# Patient Record
Sex: Female | Born: 1965 | Race: White | Hispanic: No | Marital: Married | Smoking: Former smoker
Health system: Southern US, Community
[De-identification: ages and names within clinical notes are randomized; demographics above are authoritative.]

## PROBLEM LIST (undated history)

## (undated) DIAGNOSIS — M1990 Unspecified osteoarthritis, unspecified site: Secondary | ICD-10-CM

## (undated) HISTORY — PX: HAND SURGERY: SHX662

## (undated) HISTORY — PX: DILATION AND CURETTAGE OF UTERUS: SHX78

## (undated) HISTORY — PX: AUGMENTATION MAMMAPLASTY: SUR837

## (undated) HISTORY — DX: Unspecified osteoarthritis, unspecified site: M19.90

## (undated) HISTORY — PX: FOOT SURGERY: SHX648

## (undated) HISTORY — PX: TUBAL LIGATION: SHX77

## (undated) HISTORY — PX: ENDOMETRIAL ABLATION: SHX621

## (undated) HISTORY — PX: BREAST SURGERY: SHX581

---

## 2002-06-20 ENCOUNTER — Other Ambulatory Visit: Admission: RE | Admit: 2002-06-20 | Discharge: 2002-06-20 | Payer: Self-pay | Admitting: *Deleted

## 2002-08-11 ENCOUNTER — Ambulatory Visit (HOSPITAL_COMMUNITY): Admission: RE | Admit: 2002-08-11 | Discharge: 2002-08-11 | Payer: Self-pay | Admitting: Family Medicine

## 2002-08-11 ENCOUNTER — Encounter: Payer: Self-pay | Admitting: Family Medicine

## 2005-06-09 ENCOUNTER — Other Ambulatory Visit: Admission: RE | Admit: 2005-06-09 | Discharge: 2005-06-09 | Payer: Self-pay | Admitting: *Deleted

## 2005-12-03 ENCOUNTER — Emergency Department (HOSPITAL_COMMUNITY): Admission: EM | Admit: 2005-12-03 | Discharge: 2005-12-03 | Payer: Self-pay | Admitting: Emergency Medicine

## 2006-07-23 ENCOUNTER — Ambulatory Visit (HOSPITAL_COMMUNITY): Admission: RE | Admit: 2006-07-23 | Discharge: 2006-07-23 | Payer: Self-pay | Admitting: Family Medicine

## 2006-08-24 ENCOUNTER — Other Ambulatory Visit: Admission: RE | Admit: 2006-08-24 | Discharge: 2006-08-24 | Payer: Self-pay | Admitting: *Deleted

## 2007-08-18 ENCOUNTER — Other Ambulatory Visit: Admission: RE | Admit: 2007-08-18 | Discharge: 2007-08-18 | Payer: Self-pay | Admitting: *Deleted

## 2012-02-23 DIAGNOSIS — M19079 Primary osteoarthritis, unspecified ankle and foot: Secondary | ICD-10-CM | POA: Insufficient documentation

## 2015-02-19 ENCOUNTER — Other Ambulatory Visit (HOSPITAL_COMMUNITY)
Admission: RE | Admit: 2015-02-19 | Discharge: 2015-02-19 | Disposition: A | Discharge status: Home / Self Care | Payer: BC Managed Care – PPO | Source: Ambulatory Visit | Attending: Gynecology | Admitting: Gynecology

## 2015-02-19 ENCOUNTER — Encounter: Payer: Self-pay | Admitting: Gynecology

## 2015-02-19 ENCOUNTER — Ambulatory Visit (INDEPENDENT_AMBULATORY_CARE_PROVIDER_SITE_OTHER): Payer: BC Managed Care – PPO | Admitting: Gynecology

## 2015-02-19 VITALS — BP 124/80 | Ht 60.0 in | Wt 150.0 lb

## 2015-02-19 DIAGNOSIS — N951 Menopausal and female climacteric states: Secondary | ICD-10-CM | POA: Diagnosis not present

## 2015-02-19 DIAGNOSIS — Z1151 Encounter for screening for human papillomavirus (HPV): Secondary | ICD-10-CM | POA: Diagnosis present

## 2015-02-19 DIAGNOSIS — Z01419 Encounter for gynecological examination (general) (routine) without abnormal findings: Secondary | ICD-10-CM | POA: Insufficient documentation

## 2015-02-19 DIAGNOSIS — R140 Abdominal distension (gaseous): Secondary | ICD-10-CM

## 2015-02-19 NOTE — Addendum Note (Signed)
Addended by: Berna SpareASTILLO, Ajmal Kathan A on: 02/19/2015 10:54 AM   Modules accepted: Orders

## 2015-02-19 NOTE — Progress Notes (Signed)
Alicia Bowman 20-Aug-1966 [id number]   History:    49 y.o.  for annual gyn exam who is a new patient to the practice. Patient prior gynecologist was Dr. Jeanine Luz which she has not seen in many years. She has not had a gynecological exam. She stated that many years ago she had D&C for dysfunctional uterine bleeding and also he had done an endometrial ablation for menorrhagia in the past. She denied any prior history of any abnormal Pap smear. Her PCP is Dr. Tiburcio Pea at [facility] who is been doing her blood work. She is also overdue for her mammogram. Patient was having some slight vasomotor symptoms and some vague lower abdominal discomfort and slight bloating but no dysfunctional uterine bleeding reported.  Past medical history,surgical history, family history and social history were all reviewed and documented in the EPIC chart.  Gynecologic History No LMP recorded. Patient has had an ablation. Contraception: None Last Pap: Over 5 years ago. Results were: normal Last mammogram: 2 years ago. Results were: normal  Obstetric History OB History  Gravida Para Term Preterm AB SAB TAB Ectopic Multiple Living  # Outcome Date GA Lbr Len/2nd Weight Sex Delivery Anes PTL Lv  3 Para           2 Para           1 Para                ROS: A ROS was performed and pertinent positives and negatives are included in the history.  GENERAL: No fevers or chills. HEENT: No change in vision, no earache, sore throat or sinus congestion. NECK: No pain or stiffness. CARDIOVASCULAR: No chest pain or pressure. No palpitations. PULMONARY: No shortness of breath, cough or wheeze. GASTROINTESTINAL: No abdominal pain, nausea, vomiting or diarrhea, melena or bright red blood per rectum. GENITOURINARY: No urinary frequency, urgency, hesitancy or dysuria. MUSCULOSKELETAL: No joint or muscle pain, no back pain, no recent trauma. DERMATOLOGIC: No rash, no itching, no lesions. ENDOCRINE: No  polyuria, polydipsia, no heat or cold intolerance. No recent change in weight. HEMATOLOGICAL: No anemia or easy bruising or bleeding. NEUROLOGIC: No headache, seizures, numbness, tingling or weakness. PSYCHIATRIC: No depression, no loss of interest in normal activity or change in sleep pattern.     Exam: chaperone present  BP 124/80 mmHg  Ht 5' (1.524 m)  Wt 150 lb (68.04 kg)  BMI 29.30 kg/m2  Body mass index is 29.3 kg/(m^2).  General appearance : Well developed well nourished female. No acute distress HEENT: Eyes: no retinal hemorrhage or exudates,  Neck supple, trachea midline, no carotid bruits, no thyroidmegaly Lungs: Clear to auscultation, no rhonchi or wheezes, or rib retractions  Heart: Regular rate and rhythm, no murmurs or gallops Breast:Examined in sitting and supine position were symmetrical in appearance, no palpable masses or tenderness,  no skin retraction, no nipple inversion, no nipple discharge, no skin discoloration, no axillary or supraclavicular lymphadenopathy Abdomen: no palpable masses or tenderness, no rebound or guarding Extremities: no edema or skin discoloration or tenderness  Pelvic:  Bartholin, Urethra, Skene Glands: Within normal limits             Vagina: No gross lesions or discharge  Cervix: No gross lesions or discharge, ectropion  Uterus  anteverted, normal size, shape and consistency, non-tender and mobile  Adnexa  Without masses or tenderness  Anus and perineum  normal  Rectovaginal  normal sphincter tone without palpated masses or tenderness             Hemoccult not indicated     Assessment/Plan:  49 y.o. female for annual exam who is long overdue for her gynecological examination. Since his been over 5 years since her last Pap smear will will do a Pap smear with HPV screening today. Her PCP we'll be doing her blood work. She was given a requisition to schedule her mammogram. Because of the bloating sensation and slight tenderness on the left  lower quadrant at time of exam today pelvic ultrasound was ordered. We'll also check her FSH and she appears to be perimenopausal. She was reminded of the importance of calcium vitamin D and regular exercise for osteoporosis prevention.   Ok EdwardsFERNANDEZ,JUAN H MD, 10:48 AM 02/19/2015

## 2015-02-19 NOTE — Patient Instructions (Signed)
Transvaginal Ultrasound Transvaginal ultrasound is a pelvic ultrasound, using a metal probe that is placed in the vagina, to look at a women's female organs. Transvaginal ultrasound is a method of seeing inside the pelvis of a woman. The ultrasound machine sends out sound waves from the transducer (probe). These sound waves bounce off body structures (like an echo) to create a picture. The picture shows up on a monitor. It is called transvaginal because the probe is inserted into the vagina. There should be very little discomfort from the vaginal probe. This test can also be used during pregnancy. Endovaginal ultrasound is another name for a transvaginal ultrasound. In a transabdominal ultrasound, the probe is placed on the outside of the belly. This method gives pictures that are lower quality than pictures from the transvaginal technique. Transvaginal ultrasound is used to look for problems of the female genital tract. Some such problems include:  Infertility problems.  Congenital (birth defect) malformations of the uterus and ovaries.  Tumors in the uterus.  Abnormal bleeding.  Ovarian tumors and cysts.  Abscess (inflamed tissue around pus) in the pelvis.  Unexplained abdominal or pelvic pain.  Pelvic infection. DURING PREGNANCY, TRANSVAGINAL ULTRASOUND MAY BE USED TO LOOK AT:  Normal pregnancy.  Ectopic pregnancy (pregnancy outside the uterus).  Fetal heartbeat.  Abnormalities in the pelvis, that are not seen well with transabdominal ultrasound.  Suspected twins or multiples.  Impending miscarriage.  Problems with the cervix (incompetent cervix, not able to stay closed and hold the baby).  When doing an amniocentesis (removing fluid from the pregnancy sac, for testing).  Looking for abnormalities of the baby.  Checking the growth, development, and age of the fetus.  Measuring the amount of fluid in the amniotic sac.  When doing an external version of the baby (moving  baby into correct position).  Evaluating the baby for problems in high risk pregnancies (biophysical profile).  Suspected fetal demise (death). Sometimes a special ultrasound method called Saline Infusion Sonography (SIS) is used for a more accurate look at the uterus. Sterile saline (salt water) is injected into the uterus of non-pregnant patients to see the inside of the uterus better. SIS is not used on pregnant women. The vaginal probe can also assist in obtaining biopsies of abnormal areas, in draining fluid from cysts on the ovary, and in finding IUDs (intrauterine device, birth control) that cannot be located. PREPARATION FOR TEST A transvaginal ultrasound is done with the bladder empty. The transabdominal ultrasound is done with your bladder full. You may be asked to drink several glasses of water before that exam. Sometimes, a transabdominal ultrasound is done just after a transvaginal ultrasound, to look at organs in your abdomen. PROCEDURE  You will lie down on a table, with your knees bent and your feet in foot holders. The probe is covered with a condom. A sterile lubricant is put into the vagina and on the probe. The lubricant helps transmit the sound waves and avoid irritating the vagina. Your caregiver will move the probe inside the vaginal cavity to scan the pelvic structures. A normal test will show a normal pelvis and normal contents. An abnormal test will show abnormalities of the pelvis, placenta, or baby. ABNORMAL RESULTS MAY BE DUE TO:  Growths or tumors in the:  Uterus.  Ovaries.  Vagina.  Other pelvic structures.  Non-cancerous growths of the uterus and ovaries.  Twisting of the ovary, cutting off blood supply to the ovary (ovarian torsion).  Areas of infection, including:  Pelvic  inflammatory disease.  Abscess in the pelvis.  Locating an IUD. PROBLEMS FOUND IN PREGNANT WOMEN MAY INCLUDE:  Ectopic pregnancy (pregnancy outside the uterus).  Multiple  pregnancies.  Early dilation (opening) of the cervix. This may indicate an incompetent cervix and early delivery.  Impending miscarriage.  Fetal death.  Problems with the placenta, including:  Placenta has grown over the opening of the womb (placenta previa).  Placenta has separated early in the womb (placental abruption).  Placenta grows into the muscle of the uterus (placenta accreta).  Tumors of pregnancy, including gestational trophoblastic disease. This is an abnormal pregnancy, with no fetus. The uterus is filled with many grape-like cysts that could sometimes be cancerous.  Incorrect position of the fetus (breech, vertex).  Intrauterine fetal growth retardation (IUGR) (poor growth in the womb).  Fetal abnormalities or infection. RISKS AND COMPLICATIONS There are no known risks to the ultrasound procedure. There is no X-ray used when doing an ultrasound. Document Released: 10/28/2004 Document Revised: 02/08/2012 Document Reviewed: 10/16/2009 [facility] Patient Information 2015 [place], [place]. This information is not intended to replace advice given to you by your health care provider. Make sure you discuss any questions you have with your health care provider. Perimenopause Perimenopause is the time when your body begins to move into the menopause (no menstrual period for 12 straight months). It is a natural process. Perimenopause can begin 2-8 years before the menopause and usually lasts for 1 year after the menopause. During this time, your ovaries may or may not produce an egg. The ovaries vary in their production of estrogen and progesterone hormones each month. This can cause irregular menstrual periods, difficulty getting pregnant, vaginal bleeding between periods, and uncomfortable symptoms. CAUSES  Irregular production of the ovarian hormones, estrogen and progesterone, and not ovulating every month.  Other causes include:  Tumor of the pituitary gland in the  brain.  Medical disease that affects the ovaries.  Radiation treatment.  Chemotherapy.  Unknown causes.  Heavy smoking and excessive alcohol intake can bring on perimenopause sooner. SIGNS AND SYMPTOMS   Hot flashes.  Night sweats.  Irregular menstrual periods.  Decreased sex drive.  Vaginal dryness.  Headaches.  Mood swings.  Depression.  Memory problems.  Irritability.  Tiredness.  Weight gain.  Trouble getting pregnant.  The beginning of losing bone cells (osteoporosis).  The beginning of hardening of the arteries (atherosclerosis). DIAGNOSIS  Your health care provider will make a diagnosis by analyzing your age, menstrual history, and symptoms. He or she will do a physical exam and note any changes in your body, especially your female organs. Female hormone tests may or may not be helpful depending on the amount of female hormones you produce and when you produce them. However, other hormone tests may be helpful to rule out other problems. TREATMENT  In some cases, no treatment is needed. The decision on whether treatment is necessary during the perimenopause should be made by you and your health care provider based on how the symptoms are affecting you and your lifestyle. Various treatments are available, such as:  Treating individual symptoms with a specific medicine for that symptom.  Herbal medicines that can help specific symptoms.  Counseling.  Group therapy. HOME CARE INSTRUCTIONS   Keep track of your menstrual periods (when they occur, how heavy they are, how long between periods, and how long they last) as well as your symptoms and when they started.  Only take over-the-counter or prescription medicines as directed by your health care provider.  Sleep and rest.  Exercise.  Eat a diet that contains calcium (good for your bones) and soy (acts like the estrogen hormone).  Do not smoke.  Avoid alcoholic beverages.  Take vitamin  supplements as recommended by your health care provider. Taking vitamin E may help in certain cases.  Take calcium and vitamin D supplements to help prevent bone loss.  Group therapy is sometimes helpful.  Acupuncture may help in some cases. SEEK MEDICAL CARE IF:   You have questions about any symptoms you are having.  You need a referral to a specialist (gynecologist, psychiatrist, or psychologist). SEEK IMMEDIATE MEDICAL CARE IF:   You have vaginal bleeding.  Your period lasts longer than 8 days.  Your periods are recurring sooner than 21 days.  You have bleeding after intercourse.  You have severe depression.  You have pain when you urinate.  You have severe headaches.  You have vision problems. Document Released: 12/24/2004 Document Revised: 09/06/2013 Document Reviewed: 06/15/2013 [facility] Patient Information 2015 [place]. This information is not intended to replace advice given to you by your health care provider. Make sure you discuss any questions you have with your health care provider.

## 2015-02-20 LAB — CYTOLOGY - PAP

## 2015-02-20 LAB — FOLLICLE STIMULATING HORMONE: FSH: 4.7 m[IU]/mL

## 2015-03-08 ENCOUNTER — Ambulatory Visit (INDEPENDENT_AMBULATORY_CARE_PROVIDER_SITE_OTHER): Payer: BC Managed Care – PPO | Admitting: Gynecology

## 2015-03-08 ENCOUNTER — Other Ambulatory Visit: Payer: Self-pay | Admitting: Gynecology

## 2015-03-08 ENCOUNTER — Ambulatory Visit (INDEPENDENT_AMBULATORY_CARE_PROVIDER_SITE_OTHER): Payer: BC Managed Care – PPO

## 2015-03-08 ENCOUNTER — Encounter: Payer: Self-pay | Admitting: Gynecology

## 2015-03-08 VITALS — BP 124/80

## 2015-03-08 DIAGNOSIS — R140 Abdominal distension (gaseous): Secondary | ICD-10-CM | POA: Diagnosis not present

## 2015-03-08 DIAGNOSIS — N930 Postcoital and contact bleeding: Secondary | ICD-10-CM

## 2015-03-08 DIAGNOSIS — R1032 Left lower quadrant pain: Secondary | ICD-10-CM

## 2015-03-08 DIAGNOSIS — K5909 Other constipation: Secondary | ICD-10-CM | POA: Diagnosis not present

## 2015-03-08 MED ORDER — LINACLOTIDE 145 MCG PO CAPS: 145.0000 ug | ORAL_CAPSULE | Freq: Every day | ORAL | Status: AC

## 2015-03-08 NOTE — Patient Instructions (Signed)
Linaclotide oral capsules What is this medicine? LINACLOTIDE (lin a KLOE tide) is used to treat irritable bowel syndrome (IBS) with constipation as the main problem. It may also be used for relief of chronic constipation. This medicine may be used for other purposes; ask your health care provider or pharmacist if you have questions. COMMON BRAND NAME(S): Linzess What should I tell my health care provider before I take this medicine? They need to know if you have any of these conditions: -history of stool (fecal) impaction -now have diarrhea or have diarrhea often -other medical condition -stomach or intestinal disease, including bowel obstruction or abdominal adhesions -an unusual or allergic reaction to linaclotide, other medicines, foods, dyes, or preservatives -pregnant or trying to get pregnant -breast-feeding How should I use this medicine? Take this medicine by mouth with a glass of water. Follow the directions on the prescription label. Do not cut, crush or chew this medicine. Take on an empty stomach, at least 30 minutes before your first meal of the day. Take your medicine at regular intervals. Do not take your medicine more often than directed. Do not stop taking except on your doctor's advice. A special MedGuide will be given to you by the pharmacist with each prescription and refill. Be sure to read this information carefully each time. Talk to your pediatrician regarding the use of this medicine in children. This medicine is not approved for use in children. Overdosage: If you think you've taken too much of this medicine contact a poison control center or emergency room at once. Overdosage: If you think you have taken too much of this medicine contact a poison control center or emergency room at once. NOTE: This medicine is only for you. Do not share this medicine with others. What if I miss a dose? If you miss a dose, just skip that dose. Wait until your next dose, and take only  that dose. Do not take double or extra doses. What may interact with this medicine? -certain medicines for bowel problems or bladder incontinence (these can cause constipation) This list may not describe all possible interactions. Give your health care provider a list of all the medicines, herbs, non-prescription drugs, or dietary supplements you use. Also tell them if you smoke, drink alcohol, or use illegal drugs. Some items may interact with your medicine. What should I watch for while using this medicine? Visit your doctor for regular check ups. Tell your doctor if your symptoms do not get better or if they get worse. Diarrhea is a common side effect of this medicine. It often begins within 2 weeks of starting this medicine. Stop taking this medicine and call your doctor if you get severe diarrhea. Stop taking this medicine and call your doctor or go to the nearest hospital emergency room right away if you develop unusual or severe stomach-area (abdominal) pain, especially if you also have bright red, bloody stools or black stools that look like tar. What side effects may I notice from receiving this medicine? Side effects that you should report to your doctor or health care professional as soon as possible: -allergic reactions like skin rash, itching or hives, swelling of the face, lips, or tongue -black, tarry stools -bloody or watery diarrhea -new or worsening stomach pain -severe or prolonged diarrhea Side effects that usually do not require medical attention (Report these to your doctor or health care professional if they continue or are bothersome.): -bloating -gas -loose stools This list may not describe all possible side effects.   Call your doctor for medical advice about side effects. You may report side effects to FDA at 1-800-FDA-1088. Where should I keep my medicine? Keep out of the reach of children. Store at room temperature between 20 and 25 degrees C (68 and 77 degrees F).  Keep this medicine in the original container. Keep tightly closed in a dry place. Do not remove the desiccant packet from the bottle, it helps to protect your medicine from moisture. Throw away any unused medicine after the expiration date. NOTE: This sheet is a summary. It may not cover all possible information. If you have questions about this medicine, talk to your doctor, pharmacist, or health care provider.  2015, [employer]vier/[employer] Standard. (2011-08-04 13:05:27)  

## 2015-03-08 NOTE — Progress Notes (Signed)
   Patient is a 49 year old who was seen as a new patient on March 22 of this year. On that office visit she been complaining of constipation and bloating sensation and an ultrasound was ordered and this is the reason for today's visit.Patient prior gynecologist was Dr. Jeanine LuzSteven Fore which she has not seen in many years. She has not had a gynecological exam. She stated that many years ago she had D&C for dysfunctional uterine bleeding and also he had done an endometrial ablation for menorrhagia in the past. She denied any prior history of any abnormal Pap smear. [facility] who is been doing her blood work. Patient also has informing that she has been exercising regularly and trying to lose weight.  Her ultrasound today demonstrated the following: Uterus measured 8.5 x 4.8 x 3.8 cm endometrial stripe 5.4 mm right and left ovary were normal. No adnexal masses no fluid in the cul-de-sac.  Recent Pap smear was normal and FSH was in the normal range with a value of 4.7.  Assessment/plan: Because of patient's constipation (she has bowel movements once a week) I was prescribe Linzess 145 g tablet one by mouth daily. I'm going to encourage her increase her fluid intake and fiber diet. She was reminded to schedule her mammogram which is overdue as well. We'll otherwise see her back in one year or when necessary.

## 2016-03-18 ENCOUNTER — Other Ambulatory Visit: Payer: Self-pay | Admitting: Gynecology

## 2017-04-14 ENCOUNTER — Encounter: Payer: Self-pay | Admitting: Gynecology

## 2017-10-25 DIAGNOSIS — E7800 Pure hypercholesterolemia, unspecified: Secondary | ICD-10-CM | POA: Diagnosis not present

## 2017-10-25 DIAGNOSIS — G894 Chronic pain syndrome: Secondary | ICD-10-CM | POA: Diagnosis not present

## 2017-10-25 DIAGNOSIS — F419 Anxiety disorder, unspecified: Secondary | ICD-10-CM | POA: Diagnosis not present

## 2017-10-25 DIAGNOSIS — M545 Low back pain: Secondary | ICD-10-CM | POA: Diagnosis not present

## 2017-11-29 ENCOUNTER — Ambulatory Visit (INDEPENDENT_AMBULATORY_CARE_PROVIDER_SITE_OTHER): Payer: Self-pay | Admitting: Gynecology

## 2017-11-29 ENCOUNTER — Encounter: Payer: Self-pay | Admitting: Gynecology

## 2017-11-29 VITALS — BP 130/76 | Ht 60.0 in | Wt 164.0 lb

## 2017-11-29 DIAGNOSIS — Z124 Encounter for screening for malignant neoplasm of cervix: Secondary | ICD-10-CM

## 2017-11-29 DIAGNOSIS — N930 Postcoital and contact bleeding: Secondary | ICD-10-CM

## 2017-11-29 DIAGNOSIS — N841 Polyp of cervix uteri: Secondary | ICD-10-CM

## 2017-11-29 NOTE — Patient Instructions (Signed)
Follow-up for colposcopy appointment as scheduled. 

## 2017-11-29 NOTE — Addendum Note (Signed)
Addended by: Dayna BarkerGARDNER, Bulmaro Feagans K on: 11/29/2017 03:53 PM   Modules accepted: Orders

## 2017-11-29 NOTE — Progress Notes (Signed)
    Alicia HeirKelly Bowman 1966-06-26 [id number]        51 y.o.  G3P3 former patient of Dr. Lily PeerFernandez who has not been seen for 2-1/2 years presents complaining of postcoital bleeding.  Has monthly light spotting for her menses status post endometrial ablation in the past.  No hot flushes or sweats.  Recently started having intercourse and noticed after each episode she had some staining.  No bleeding otherwise.  No pain with the spotting.  No vaginal dryness irritation discharge odor.  No UTI symptoms such as dysuria frequency urgency low back pain fever or chills  Past medical history,surgical history, problem list, medications, allergies, family history and social history were all reviewed and documented in the EPIC chart.  Directed ROS with pertinent positives and negatives documented in the history of present illness/assessment and plan.  Exam: Kennon PortelaKim Gardner assistant Vitals:   11/29/17 1520  BP: 130/76  Weight: 164 lb (74.4 kg)  Height: 5' (1.524 m)   General appearance:  Normal Abdomen soft nontender without masses guarding rebound Pelvic external BUS vagina normal.  Cervix with large polypoid mass originating from posterior canal.  Pap smear done over this area and within the endocervical canal uterus grossly normal midline mobile nontender.  Adnexa without masses or tenderness  Assessment/Plan:  51 y.o. G3P3 with postcoital spotting and exam showing large polypoid vascular mass.  Manipulation with a Q-tip shows its more attached posteriorly on a broad base.  Discussed with patient and I recommended she follow-up for colposcopy for a closer inspection and possible LEEP at that time to excise the polyp.  Patient agrees with scheduling the appointment.    Dara Lordsimothy P Montrice Gracey MD, 3:37 PM 11/29/2017

## 2017-12-02 LAB — PAP IG W/ RFLX HPV ASCU

## 2017-12-24 ENCOUNTER — Ambulatory Visit: Payer: BLUE CROSS/BLUE SHIELD | Admitting: Gynecology

## 2017-12-24 ENCOUNTER — Encounter: Payer: Self-pay | Admitting: Gynecology

## 2017-12-24 VITALS — BP 118/76

## 2017-12-24 DIAGNOSIS — N889 Noninflammatory disorder of cervix uteri, unspecified: Secondary | ICD-10-CM | POA: Diagnosis not present

## 2017-12-24 DIAGNOSIS — N841 Polyp of cervix uteri: Secondary | ICD-10-CM | POA: Diagnosis not present

## 2017-12-24 NOTE — Patient Instructions (Signed)
Office will call you with biopsy results 

## 2017-12-24 NOTE — Progress Notes (Signed)
    Urbano HeirKelly Woolstenhulme 1966-10-11 [id number]        52 y.o.  Gwenyth BenderG3P3 presents for colposcopy and possible biopsy.  Patient presented with postcoital spotting and was found to have a large polypoid mass from the posterior cervix.  No history of same before.  Pap smear done at that time was normal.  Past medical history,surgical history, problem list, medications, allergies, family history and social history were all reviewed and documented in the EPIC chart.  Directed ROS with pertinent positives and negatives documented in the history of present illness/assessment and plan.  Exam: Kennon PortelaKim Gardner assistant Vitals:   12/24/17 1420  BP: 118/76   General appearance:  Normal Pelvic external BUS vagina normal.  Cervix with bulbous polypoid lesion posterior cervix.  Smaller area 9:00 area.  Uterus grossly normal midline mobile nontender.  Adnexa without mass.  Colposcopy performed after acetic acid cleanse is adequate with bulbous appearing area visualized but no atypical patterns such as acetowhite, punctations, mosaicism or atypical vessels. Physical Exam  Genitourinary:       Procedure: The base of the bulbous area was infiltrated with 2% lidocaine with 1-100,000 epinephrine dilution as was the area at 9:00.  5 cc total used.  Using the 12 x 20 LEEP wand on a setting of 60 W coagulation 60 W cutting blend 1 current both areas were excised in their entirety at the level of the surrounding normal-appearing cervical mucosa.  Ball coagulation applied for ultimate hemostasis.  Both specimens were sent to pathology.  Patient tolerated well.  Assessment/Plan:  52 y.o. G3P3 with bulbous appearing areas on the cervix.  Pap smear of the overlying mucosa previously was normal.  Both areas excised in their entirety.  Patient will follow-up for biopsy results.  Possibilities were reviewed to include normal cervical tissue up to and including neoplasia.    Dara Lordsimothy P Ruel Dimmick MD, 2:56 PM 12/24/2017

## 2017-12-24 NOTE — Addendum Note (Signed)
Addended by: Dara LordsFONTAINE, Nollie Terlizzi P on: 12/24/2017 03:14 PM   Modules accepted: Orders

## 2017-12-28 ENCOUNTER — Encounter: Payer: Self-pay | Admitting: Gynecology

## 2017-12-28 LAB — TISSUE SPECIMEN

## 2017-12-28 LAB — PATHOLOGY

## 2017-12-31 ENCOUNTER — Encounter: Payer: Self-pay | Admitting: Gynecology

## 2017-12-31 ENCOUNTER — Ambulatory Visit (INDEPENDENT_AMBULATORY_CARE_PROVIDER_SITE_OTHER): Payer: BLUE CROSS/BLUE SHIELD | Admitting: Gynecology

## 2017-12-31 VITALS — BP 124/78 | Ht 60.0 in | Wt 164.0 lb

## 2017-12-31 DIAGNOSIS — Z01419 Encounter for gynecological examination (general) (routine) without abnormal findings: Secondary | ICD-10-CM | POA: Diagnosis not present

## 2017-12-31 NOTE — Progress Notes (Signed)
    Alicia Bowman July 24, 1966 [id number]        52 y.o.  G3P3 for annual gynecologic exam.  Recently had bulbous area on her cervix excised which turned out to be nabothian cysts and benign endocervical polyp.  Has done well since then.  Past medical history,surgical history, problem list, medications, allergies, family history and social history were all reviewed and documented as reviewed in the EPIC chart.  ROS:  Performed with pertinent positives and negatives included in the history, assessment and plan.   Additional significant findings : None   Exam: Kennon PortelaKim Gardner assistant Vitals:   12/31/17 1504  BP: 124/78  Weight: 164 lb (74.4 kg)  Height: 5' (1.524 m)   Body mass index is 32.03 kg/m.  General appearance:  Normal affect, orientation and appearance. Skin: Grossly normal HEENT: Without gross lesions.  No cervical or supraclavicular adenopathy. Thyroid normal.  Lungs:  Clear without wheezing, rales or rhonchi Cardiac: RR, without RMG Abdominal:  Soft, nontender, without masses, guarding, rebound, organomegaly or hernia Breasts:  Examined lying and sitting without masses, retractions, discharge or axillary adenopathy.  Bilateral implants noted Pelvic:  Ext, BUS, Vagina: Normal.  Cervix: With healing biopsy site otherwise grossly normal.  Uterus: Anteverted, normal size, shape and contour, midline and mobile nontender   Adnexa: Without masses or tenderness    Anus and perineum: Normal   Rectovaginal: Normal sphincter tone without palpated masses or tenderness.    Assessment/Plan:  52 y.o. G3P3 female for annual gynecologic exam with light monthly spotting menses, tubal sterilization.   1. Perimenopausal.  Continues with light monthly spotting for menses following her endometrial ablation.  No prolonged or atypical bleeding.  No significant hot flushes, night sweats or vaginal dryness. 2. Pap smear 10/2017.  No Pap smear done today.  No history of significant abnormal Pap  smears.  Plan repeat Pap smear 3-year interval per current screening guidelines. 3. Mammography many years ago.  I stressed the need to schedule a screening mammogram.  Benefits of early detection reviewed.  Breast exam normal today. 4. Colonoscopy never.  I recommended patient move towards scheduling a colonoscopy over this year or next as she is turned 51.  Patient agrees to do so. 5. Health maintenance.  No routine lab work done as patient has this done elsewhere.  Follow-up in 1 year, sooner as needed.   Dara Lordsimothy P Montrail Mehrer MD, 3:23 PM 12/31/2017

## 2017-12-31 NOTE — Patient Instructions (Signed)
Call to Schedule your mammogram  Facilities in [place]: 1)  [facility]. [facility], [address] Phone: [phone] 2)  Dr. Bertrand at [facility]  [address] Phone: [phone]     Mammogram A mammogram is an X-ray test to find changes in a woman's breast. You should get a mammogram if:  You are 51 years of age or older  You have risk factors.   Your doctor recommends that you have one.  BEFORE THE TEST  Do not schedule the test the week before your period, especially if your breasts are sore during this time.  On the day of your mammogram:  Wash your breasts and armpits well. After washing, do not put on any deodorant or talcum powder on until after your test.   Eat and drink as you usually do.   Take your medicines as usual.   If you are diabetic and take insulin, make sure you:   Eat before coming for your test.   Take your insulin as usual.   If you cannot keep your appointment, call before the appointment to cancel. Schedule another appointment.  TEST  You will need to undress from the waist up. You will put on a hospital gown.   Your breast will be put on the mammogram machine, and it will press firmly on your breast with a piece of plastic called a compression paddle. This will make your breast flatter so that the machine can X-ray all parts of your breast.   Both breasts will be X-rayed. Each breast will be X-rayed from above and from the side. An X-ray might need to be taken again if the picture is not good enough.   The mammogram will last about 15 to 30 minutes.  AFTER THE TEST Finding out the results of your test Ask when your test results will be ready. Make sure you get your test results.  Document Released: 02/12/2009 Document Revised: 11/05/2011 Document Reviewed: 02/12/2009 ExitCare Patient Information 2012 [employer].   

## 2018-03-07 DIAGNOSIS — Z981 Arthrodesis status: Secondary | ICD-10-CM | POA: Diagnosis not present

## 2018-03-07 DIAGNOSIS — M19071 Primary osteoarthritis, right ankle and foot: Secondary | ICD-10-CM | POA: Diagnosis not present

## 2018-03-07 DIAGNOSIS — M25579 Pain in unspecified ankle and joints of unspecified foot: Secondary | ICD-10-CM | POA: Diagnosis not present

## 2018-03-07 DIAGNOSIS — M25571 Pain in right ankle and joints of right foot: Secondary | ICD-10-CM | POA: Diagnosis not present

## 2018-03-07 DIAGNOSIS — M25572 Pain in left ankle and joints of left foot: Secondary | ICD-10-CM | POA: Diagnosis not present

## 2018-03-25 DIAGNOSIS — M89371 Hypertrophy of bone, right ankle and foot: Secondary | ICD-10-CM | POA: Diagnosis not present

## 2018-03-25 DIAGNOSIS — Z981 Arthrodesis status: Secondary | ICD-10-CM | POA: Diagnosis not present

## 2018-03-25 DIAGNOSIS — M19071 Primary osteoarthritis, right ankle and foot: Secondary | ICD-10-CM | POA: Diagnosis not present

## 2018-04-07 DIAGNOSIS — H04123 Dry eye syndrome of bilateral lacrimal glands: Secondary | ICD-10-CM | POA: Diagnosis not present

## 2018-04-11 DIAGNOSIS — M87071 Idiopathic aseptic necrosis of right ankle: Secondary | ICD-10-CM | POA: Diagnosis not present

## 2018-04-11 DIAGNOSIS — M19071 Primary osteoarthritis, right ankle and foot: Secondary | ICD-10-CM | POA: Diagnosis not present

## 2018-04-21 DIAGNOSIS — H04123 Dry eye syndrome of bilateral lacrimal glands: Secondary | ICD-10-CM | POA: Diagnosis not present

## 2018-04-28 DIAGNOSIS — F324 Major depressive disorder, single episode, in partial remission: Secondary | ICD-10-CM | POA: Diagnosis not present

## 2018-04-28 DIAGNOSIS — G894 Chronic pain syndrome: Secondary | ICD-10-CM | POA: Diagnosis not present

## 2018-04-28 DIAGNOSIS — F419 Anxiety disorder, unspecified: Secondary | ICD-10-CM | POA: Diagnosis not present

## 2018-04-28 DIAGNOSIS — E7800 Pure hypercholesterolemia, unspecified: Secondary | ICD-10-CM | POA: Diagnosis not present

## 2018-05-18 DIAGNOSIS — M19071 Primary osteoarthritis, right ankle and foot: Secondary | ICD-10-CM | POA: Diagnosis not present

## 2018-06-28 DIAGNOSIS — H01021 Squamous blepharitis right upper eyelid: Secondary | ICD-10-CM | POA: Diagnosis not present

## 2018-06-28 DIAGNOSIS — H01022 Squamous blepharitis right lower eyelid: Secondary | ICD-10-CM | POA: Diagnosis not present

## 2018-06-28 DIAGNOSIS — H16223 Keratoconjunctivitis sicca, not specified as Sjogren's, bilateral: Secondary | ICD-10-CM | POA: Diagnosis not present

## 2018-06-28 DIAGNOSIS — H10413 Chronic giant papillary conjunctivitis, bilateral: Secondary | ICD-10-CM | POA: Diagnosis not present

## 2018-07-06 ENCOUNTER — Encounter: Payer: Self-pay | Admitting: Adult Health

## 2018-07-06 ENCOUNTER — Ambulatory Visit (INDEPENDENT_AMBULATORY_CARE_PROVIDER_SITE_OTHER): Payer: BLUE CROSS/BLUE SHIELD | Admitting: Adult Health

## 2018-07-06 DIAGNOSIS — G8929 Other chronic pain: Secondary | ICD-10-CM | POA: Diagnosis not present

## 2018-07-06 DIAGNOSIS — R635 Abnormal weight gain: Secondary | ICD-10-CM | POA: Diagnosis not present

## 2018-07-06 MED ORDER — PHENTERMINE HCL 37.5 MG PO CAPS: 37.5000 mg | ORAL_CAPSULE | ORAL | 0 refills | Status: DC

## 2018-07-06 NOTE — Patient Instructions (Signed)
Exercising to Lose Weight Exercising can help you to lose weight. In order to lose weight through exercise, you need to do vigorous-intensity exercise. You can tell that you are exercising with vigorous intensity if you are breathing very hard and fast and cannot hold a conversation while exercising. Moderate-intensity exercise helps to maintain your current weight. You can tell that you are exercising at a moderate level if you have a higher heart rate and faster breathing, but you are still able to hold a conversation. How often should I exercise? Choose an activity that you enjoy and set realistic goals. Your health care provider can help you to make an activity plan that works for you. Exercise regularly as directed by your health care provider. This may include:  Doing resistance training twice each week, such as: ? Push-ups. ? Sit-ups. ? Lifting weights. ? Using resistance bands.  Doing a given intensity of exercise for a given amount of time. Choose from these options: ? 150 minutes of moderate-intensity exercise every week. ? 75 minutes of vigorous-intensity exercise every week. ? A mix of moderate-intensity and vigorous-intensity exercise every week.  Children, pregnant women, people who are out of shape, people who are overweight, and older adults may need to consult a health care provider for individual recommendations. If you have any sort of medical condition, be sure to consult your health care provider before starting a new exercise program. What are some activities that can help me to lose weight?  Walking at a rate of at least 4.5 miles an hour.  Jogging or running at a rate of 5 miles per hour.  Biking at a rate of at least 10 miles per hour.  Lap swimming.  Roller-skating or in-line skating.  Cross-country skiing.  Vigorous competitive sports, such as football, basketball, and soccer.  Jumping rope.  Aerobic dancing. How can I be more active in my day-to-day  activities?  Use the stairs instead of the elevator.  Take a walk during your lunch break.  If you drive, park your car farther away from work or school.  If you take public transportation, get off one stop early and walk the rest of the way.  Make all of your phone calls while standing up and walking around.  Get up, stretch, and walk around every 30 minutes throughout the day. What guidelines should I follow while exercising?  Do not exercise so much that you hurt yourself, feel dizzy, or get very short of breath.  Consult your health care provider prior to starting a new exercise program.  Wear comfortable clothes and shoes with good support.  Drink plenty of water while you exercise to prevent dehydration or heat stroke. Body water is lost during exercise and must be replaced.  Work out until you breathe faster and your heart beats faster. This information is not intended to replace advice given to you by your health care provider. Make sure you discuss any questions you have with your health care provider. Document Released: 12/19/2010 Document Revised: 04/23/2016 Document Reviewed: 04/19/2014 Elsevier Interactive Patient Education  2018 Elsevier Inc.  

## 2018-07-06 NOTE — Progress Notes (Signed)
[facility] [address] [place], [place] [address]  Internal MEDICINE  Office Visit Note  Patient Name: Alicia Bowman  [id number]  [id number]  Date of Service: 07/06/2018  Chief Complaint  Patient presents with  . Medical Management of Chronic Issues    new patient weight loss     HPI Pt here for initial visit for weight loss.  She denies cardiac issues.  No chest pain, palpitations, or SOB.  She is interested in an appetite suppressant.  She was on medication over 5 years ago but she can not remember the name of it.      Current Medication: Outpatient Encounter Medications as of 07/06/2018  Medication Sig  . HYDROcodone-acetaminophen (NORCO) 7.5-325 MG per tablet Take 1 tablet by mouth every 6 (six) hours as needed for moderate pain.  . Linaclotide (LINZESS) 145 MCG CAPS capsule Take 1 capsule (145 mcg total) by mouth daily.  Marland Kitchen LORazepam (ATIVAN) 0.5 MG tablet Take 0.5 mg by mouth every 8 (eight) hours.  . methocarbamol (ROBAXIN) 500 MG tablet Take 500 mg by mouth 4 (four) times daily.  Marland Kitchen zolpidem (AMBIEN) 10 MG tablet Take 10 mg by mouth at bedtime as needed for sleep.  . DULoxetine (CYMBALTA) 30 MG capsule Take 30 mg by mouth daily.   No facility-administered encounter medications on file as of 07/06/2018.     Surgical History: Past Surgical History:  Procedure Laterality Date  . AUGMENTATION MAMMAPLASTY     saline  . BREAST SURGERY    . DILATION AND CURETTAGE OF UTERUS    . ENDOMETRIAL ABLATION    . FOOT SURGERY Bilateral   . TUBAL LIGATION      Medical History: History reviewed. No pertinent past medical history.  Family History: Family History  Problem Relation Age of Onset  . Cancer Mother        Unknown origin  . Cancer Father        Lung    Social History   Socioeconomic History  . Marital status: Married    Spouse name: Not on file  . Number of children: Not on file  . Years of education: Not on file  . Highest education level: Not on  file  Occupational History  . Not on file  Social Needs  . Financial resource strain: Not on file  . Food insecurity:    Worry: Not on file    Inability: Not on file  . Transportation needs:    Medical: Not on file    Non-medical: Not on file  Tobacco Use  . Smoking status: Former Smoker    Last attempt to quit: 02/18/2009    Years since quitting: 9.3  . Smokeless tobacco: Never Used  Substance and Sexual Activity  . Alcohol use: Yes    Alcohol/week: 0.0 oz    Comment: RARE  . Drug use: No  . Sexual activity: Yes    Birth control/protection: Surgical    Comment: 1st intercourse- 16, partners- 5--BTL  Lifestyle  . Physical activity:    Days per week: Not on file    Minutes per session: Not on file  . Stress: Not on file  Relationships  . Social connections:    Talks on phone: Not on file    Gets together: Not on file    Attends religious service: Not on file    Active member of club or organization: Not on file    Attends meetings of clubs or organizations: Not on file  Relationship status: Not on file  . Intimate partner violence:    Fear of current or ex partner: Not on file    Emotionally abused: Not on file    Physically abused: Not on file    Forced sexual activity: Not on file  Other Topics Concern  . Not on file  Social History Narrative  . Not on file      Review of Systems  Constitutional: Negative for chills, fatigue and unexpected weight change.  HENT: Negative for congestion, rhinorrhea, sneezing and sore throat.   Eyes: Negative for photophobia, pain and redness.  Respiratory: Negative for cough, chest tightness and shortness of breath.   Cardiovascular: Negative for chest pain and palpitations.  Gastrointestinal: Negative for abdominal pain, constipation, diarrhea, nausea and vomiting.  Endocrine: Negative.   Genitourinary: Negative for dysuria and frequency.  Musculoskeletal: Negative for arthralgias, back pain, joint swelling and neck pain.   Skin: Negative for rash.  Allergic/Immunologic: Negative.   Neurological: Negative for tremors and numbness.  Hematological: Negative for adenopathy. Does not bruise/bleed easily.  Psychiatric/Behavioral: Negative for behavioral problems and sleep disturbance. The patient is not nervous/anxious.     Vital Signs: BP 108/78   Pulse 85   Resp 16   Ht 5' (1.524 m)   Wt 174 lb (78.9 kg)   SpO2 97%   BMI 33.98 kg/m    Physical Exam  Constitutional: She is oriented to person, place, and time. She appears well-developed and well-nourished. No distress.  HENT:  Head: Normocephalic and atraumatic.  Mouth/Throat: Oropharynx is clear and moist. No oropharyngeal exudate.  Eyes: Pupils are equal, round, and reactive to light. EOM are normal.  Neck: Normal range of motion. Neck supple. No JVD present. No tracheal deviation present. No thyromegaly present.  Cardiovascular: Normal rate, regular rhythm and normal heart sounds. Exam reveals no gallop and no friction rub.  No murmur heard. Pulmonary/Chest: Effort normal and breath sounds normal. No respiratory distress. She has no wheezes. She has no rales. She exhibits no tenderness.  Abdominal: Soft. There is no tenderness. There is no guarding.  Musculoskeletal: Normal range of motion.  Lymphadenopathy:    She has no cervical adenopathy.  Neurological: She is alert and oriented to person, place, and time. No cranial nerve deficit.  Skin: Skin is warm and dry. She is not diaphoretic.  Psychiatric: She has a normal mood and affect. Her behavior is normal. Judgment and thought content normal.  Nursing note and vitals reviewed.   Assessment/Plan: 1. Morbid obesity (HCC) Obesity Counseling: Risk Assessment: An assessment of behavioral risk factors was made today and includes lack of exercise sedentary lifestyle, lack of portion control and poor dietary habits.  Risk Modification Advice: She was counseled on portion control guidelines.  Restricting daily caloric intake to. . The detrimental long term effects of obesity on her health and ongoing poor compliance was also discussed with the patient. - Metabolic Test - phentermine 37.5 MG capsule; Take 1 capsule (37.5 mg total) by mouth every morning.  Dispense: 30 capsule; Refill: 0  There is a liability release in patients' chart. There has been a 10 minute discussion about the side effects including but not limited to elevated blood pressure, anxiety, lack of sleep and dry mouth. Pt understands and will like to start/continue on appetite suppressant at this time. There will be one month RX given at the time of visit with proper follow up. Nova diet plan with restricted calories is given to the pt. Pt understands  and agrees with  plan of treatment  2. Weight gain Pt reports she lost 20 pounds in last year, but has gained back 10lbs.   3. Other chronic pain Pts PCP follows her for chronic pain.  She takes Hydrocodone, two per day.   General Counseling: Alicia Bowman verbalizes understanding of the findings of todays visit and agrees with plan of treatment. I have discussed any further diagnostic evaluation that may be needed or ordered today. We also reviewed her medications today. she has been encouraged to call the office with any questions or concerns that should arise related to todays visit.    Orders Placed This Encounter  Procedures  . Metabolic Test    No orders of the defined types were placed in this encounter.   Time spent: 25 Minutes   This patient was seen by Blima LedgerAdam Jameca Chumley AGNP-C in Collaboration with Dr Lyndon CodeFozia M Khan as a part of collaborative care agreement    Dr Lyndon CodeFozia M Khan Internal medicine

## 2018-07-07 ENCOUNTER — Encounter: Payer: Self-pay | Admitting: Adult Health

## 2018-07-27 DIAGNOSIS — H01021 Squamous blepharitis right upper eyelid: Secondary | ICD-10-CM | POA: Diagnosis not present

## 2018-07-27 DIAGNOSIS — H01022 Squamous blepharitis right lower eyelid: Secondary | ICD-10-CM | POA: Diagnosis not present

## 2018-07-27 DIAGNOSIS — H16223 Keratoconjunctivitis sicca, not specified as Sjogren's, bilateral: Secondary | ICD-10-CM | POA: Diagnosis not present

## 2018-07-27 DIAGNOSIS — H10413 Chronic giant papillary conjunctivitis, bilateral: Secondary | ICD-10-CM | POA: Diagnosis not present

## 2018-08-10 ENCOUNTER — Encounter: Payer: Self-pay | Admitting: Adult Health

## 2018-08-10 ENCOUNTER — Ambulatory Visit: Payer: BLUE CROSS/BLUE SHIELD | Admitting: Adult Health

## 2018-08-10 DIAGNOSIS — F411 Generalized anxiety disorder: Secondary | ICD-10-CM | POA: Diagnosis not present

## 2018-08-10 DIAGNOSIS — F419 Anxiety disorder, unspecified: Secondary | ICD-10-CM | POA: Diagnosis not present

## 2018-08-10 MED ORDER — PHENTERMINE HCL 37.5 MG PO CAPS: 37.5000 mg | ORAL_CAPSULE | ORAL | 0 refills | Status: DC

## 2018-08-10 NOTE — Patient Instructions (Signed)
Phentermine sustained-release capsules What is this medicine? PHENTERMINE (FEN ter meen) decreases your appetite. It is used with a reduced calorie diet and exercise to help you lose weight. This medicine may be used for other purposes; ask your health care provider or pharmacist if you have questions. COMMON BRAND NAME(S): Ionamin, Pro-Fast What should I tell my health care provider before I take this medicine? They need to know if you have any of these conditions: -agitation -glaucoma -heart disease -high blood pressure -history of substance abuse -lung disease called Primary Pulmonary Hypertension (PPH) -taken an MAOI like Carbex, Eldepryl, Marplan, Nardil, or Parnate in last 14 days -thyroid disease -an unusual or allergic reaction to phentermine, other medicines, foods, dyes, or preservatives -pregnant or trying to get pregnant -breast-feeding How should I use this medicine? Take this medicine by mouth with a glass of water. Follow the directions on the prescription label. This medicine is usually taken before breakfast or at least 10 to 14 hours before going to bed. Avoid taking this medicine in the evening. It may interfere with sleep. Swallow whole. Do not open or chew the capsules. Take your doses at regular intervals. Do not take your medicine more often than directed. Talk to your pediatrician regarding the use of this medicine in children. Special care may be needed. Overdosage: If you think you have taken too much of this medicine contact a poison control center or emergency room at once. NOTE: This medicine is only for you. Do not share this medicine with others. What if I miss a dose? If you miss a dose, take it as soon as you can. If it is almost time for your next dose, take only that dose. Do not take double or extra doses. What may interact with this medicine? Do not take this medicine with any of the following medications: -duloxetine -MAOIs like Carbex, Eldepryl,  Marplan, Nardil, and Parnate -medicines for colds or breathing difficulties like pseudoephedrine or phenylephrine -procarbazine -sibutramine -SSRIs like citalopram, escitalopram, fluoxetine, fluvoxamine, paroxetine, and sertraline -stimulants like dexmethylphenidate, methylphenidate or modafinil -venlafaxine This medicine may also interact with the following medications: -medicines for diabetes This list may not describe all possible interactions. Give your health care provider a list of all the medicines, herbs, non-prescription drugs, or dietary supplements you use. Also tell them if you smoke, drink alcohol, or use illegal drugs. Some items may interact with your medicine. What should I watch for while using this medicine? Notify your physician immediately if you become short of breath while doing your normal activities. Do not take this medicine within 6 hours of bedtime. It can keep you from getting to sleep. Avoid drinks that contain caffeine and try to stick to a regular bedtime every night. This medicine was intended to be used in addition to a healthy diet and exercise. The best results are achieved this way. This medicine is only indicated for short-term use. Eventually your weight loss may level out. At that point, the drug will only help you maintain your new weight. Do not increase or in any way change your dose without consulting your doctor. You may get drowsy or dizzy. Do not drive, use machinery, or do anything that needs mental alertness until you know how this medicine affects you. Do not stand or sit up quickly, especially if you are an older patient. This reduces the risk of dizzy or fainting spells. Alcohol may increase dizziness and drowsiness. Avoid alcoholic drinks. What side effects may I notice from receiving this   medicine? Side effects that you should report to your doctor or health care professional as soon as possible: -chest pain, palpitations -depression or severe  changes in mood -increased blood pressure -irritability -nervousness or restlessness -severe dizziness -shortness of breath -problems urinating -unusual swelling of the legs -vomiting Side effects that usually do not require medical attention (report to your doctor or health care professional if they continue or are bothersome): -blurred vision or other eye problems -changes in sexual ability or desire -constipation or diarrhea -difficulty sleeping -dry mouth or unpleasant taste -headache -nausea This list may not describe all possible side effects. Call your doctor for medical advice about side effects. You may report side effects to FDA at 1-800-FDA-1088. Where should I keep my medicine? Keep out of the reach of children. This medicine can be abused. Keep your medicine in a safe place to protect it from theft. Do not share this medicine with anyone. Selling or giving away this medicine is dangerous and against the law. This medicine may cause accidental overdose and death if taken by other adults, children, or pets. Mix any unused medicine with a substance like cat litter or coffee grounds. Then throw the medicine away in a sealed container like a sealed bag or a coffee can with a lid. Do not use the medicine after the expiration date. Store at room temperature between 20 and 25 degrees C (68 and 77 degrees F). Keep container tightly closed. NOTE: This sheet is a summary. It may not cover all possible information. If you have questions about this medicine, talk to your doctor, pharmacist, or health care provider.  2018 Elsevier/Gold Standard (2014-08-07 16:19:17)  

## 2018-08-10 NOTE — Progress Notes (Signed)
[facility] [address] [place], [place] [address]  Internal MEDICINE  Office Visit Note  Patient Name: Alicia Bowman  [id number]  [id number]  Date of Service: 08/19/2018  Chief Complaint  Patient presents with  . Medical Management of Chronic Issues    weight management     HPI Pt here for follow up on anxiety, insomnia and weight management. She has been taking phentermine for one month, and she has lost just over 9 pounds.  She denies palpitation, headaches, or chest pain.  She does report dry mouth, and some insomnia initially.  The insomnia has resolved, and she is able to control the dry mouth.  She denies any issues and is very pleased with her progress. Her anxiety has been well controlled during this month.     Current Medication: Outpatient Encounter Medications as of 08/10/2018  Medication Sig  . phentermine 37.5 MG capsule Take 1 capsule (37.5 mg total) by mouth every morning.  . [DISCONTINUED] phentermine 37.5 MG capsule Take 1 capsule (37.5 mg total) by mouth every morning.  Marland Kitchen HYDROcodone-acetaminophen (NORCO) 7.5-325 MG per tablet Take 1 tablet by mouth every 6 (six) hours as needed for moderate pain.  . Linaclotide (LINZESS) 145 MCG CAPS capsule Take 1 capsule (145 mcg total) by mouth daily.  Marland Kitchen LORazepam (ATIVAN) 0.5 MG tablet Take 0.5 mg by mouth every 8 (eight) hours.  . methocarbamol (ROBAXIN) 500 MG tablet Take 500 mg by mouth 4 (four) times daily.  Marland Kitchen zolpidem (AMBIEN) 10 MG tablet Take 10 mg by mouth at bedtime as needed for sleep.   No facility-administered encounter medications on file as of 08/10/2018.     Surgical History: Past Surgical History:  Procedure Laterality Date  . AUGMENTATION MAMMAPLASTY     saline  . BREAST SURGERY    . DILATION AND CURETTAGE OF UTERUS    . ENDOMETRIAL ABLATION    . FOOT SURGERY Bilateral   . TUBAL LIGATION      Medical History: History reviewed. No pertinent past medical history.  Family  History: Family History  Problem Relation Age of Onset  . Cancer Mother        Unknown origin  . Cancer Father        Lung    Social History   Socioeconomic History  . Marital status: Married    Spouse name: Not on file  . Number of children: Not on file  . Years of education: Not on file  . Highest education level: Not on file  Occupational History  . Not on file  Social Needs  . Financial resource strain: Not on file  . Food insecurity:    Worry: Not on file    Inability: Not on file  . Transportation needs:    Medical: Not on file    Non-medical: Not on file  Tobacco Use  . Smoking status: Former Smoker    Last attempt to quit: 02/18/2009    Years since quitting: 9.5  . Smokeless tobacco: Never Used  Substance and Sexual Activity  . Alcohol use: Yes    Alcohol/week: 0.0 standard drinks    Comment: RARE  . Drug use: No  . Sexual activity: Yes    Birth control/protection: Surgical    Comment: 1st intercourse- 16, partners- 5--BTL  Lifestyle  . Physical activity:    Days per week: Not on file    Minutes per session: Not on file  . Stress: Not on file  Relationships  . Social  connections:    Talks on phone: Not on file    Gets together: Not on file    Attends religious service: Not on file    Active member of club or organization: Not on file    Attends meetings of clubs or organizations: Not on file    Relationship status: Not on file  . Intimate partner violence:    Fear of current or ex partner: Not on file    Emotionally abused: Not on file    Physically abused: Not on file    Forced sexual activity: Not on file  Other Topics Concern  . Not on file  Social History Narrative  . Not on file      Review of Systems  Constitutional: Negative for chills, fatigue and unexpected weight change.  HENT: Negative for congestion, rhinorrhea, sneezing and sore throat.   Eyes: Negative for photophobia, pain and redness.  Respiratory: Negative for cough, chest  tightness and shortness of breath.   Cardiovascular: Negative for chest pain and palpitations.  Gastrointestinal: Negative for abdominal pain, constipation, diarrhea, nausea and vomiting.  Endocrine: Negative.   Genitourinary: Negative for dysuria and frequency.  Musculoskeletal: Negative for arthralgias, back pain, joint swelling and neck pain.  Skin: Negative for rash.  Allergic/Immunologic: Negative.   Neurological: Negative for tremors and numbness.  Hematological: Negative for adenopathy. Does not bruise/bleed easily.  Psychiatric/Behavioral: Negative for behavioral problems and sleep disturbance. The patient is not nervous/anxious.    Vital Signs: BP 129/72   Pulse 74   Resp 16   Ht 5' (1.524 m)   Wt 164 lb 9.6 oz (74.7 kg)   SpO2 97%   BMI 32.15 kg/m    Physical Exam  Constitutional: She is oriented to person, place, and time. She appears well-developed and well-nourished. No distress.  HENT:  Head: Normocephalic and atraumatic.  Mouth/Throat: Oropharynx is clear and moist. No oropharyngeal exudate.  Eyes: Pupils are equal, round, and reactive to light. EOM are normal.  Neck: Normal range of motion. Neck supple. No JVD present. No tracheal deviation present. No thyromegaly present.  Cardiovascular: Normal rate, regular rhythm and normal heart sounds. Exam reveals no gallop and no friction rub.  No murmur heard. Pulmonary/Chest: Effort normal and breath sounds normal. No respiratory distress. She has no wheezes. She has no rales. She exhibits no tenderness.  Abdominal: Soft. There is no tenderness. There is no guarding.  Musculoskeletal: Normal range of motion.  Lymphadenopathy:    She has no cervical adenopathy.  Neurological: She is alert and oriented to person, place, and time. No cranial nerve deficit.  Skin: Skin is warm and dry. She is not diaphoretic.  Psychiatric: She has a normal mood and affect. Her behavior is normal. Judgment and thought content normal.   Nursing note and vitals reviewed.  Assessment/Plan: 1. Anxiety Well controlled at this time. Continue to use Lorazepam as prescribed.  2. Morbid obesity (HCC) Incorporate more water into her diet, and attempt to add some exercise to her routine.  Continue to eat Low calorie diet.  - phentermine 37.5 MG capsule; Take 1 capsule (37.5 mg total) by mouth every morning.  Dispense: 30 capsule; Refill: 0 Obesity Counseling: Risk Assessment: An assessment of behavioral risk factors was made today and includes lack of exercise sedentary lifestyle, lack of portion control and poor dietary habits.  Risk Modification Advice: She was counseled on portion control guidelines. Restricting daily caloric intake to. . The detrimental long term effects of obesity on her  health and ongoing poor compliance was also discussed with the patient.  There is a liability release in patients' chart. There has been a 10 minute discussion about the side effects including but not limited to elevated blood pressure, anxiety, lack of sleep and dry mouth. Pt understands and will like to start/continue on appetite suppressant at this time. There will be one month RX given at the time of visit with proper follow up. Nova diet plan with restricted calories is given to the pt. Pt understands and agrees with  plan of treatment  General Counseling: Nadirah verbalizes understanding of the findings of todays visit and agrees with plan of treatment. I have discussed any further diagnostic evaluation that may be needed or ordered today. We also reviewed her medications today. she has been encouraged to call the office with any questions or concerns that should arise related to todays visit.  Meds ordered this encounter  Medications  . phentermine 37.5 MG capsule    Sig: Take 1 capsule (37.5 mg total) by mouth every morning.    Dispense:  30 capsule    Refill:  0    Time spent: 25 Minutes   This patient was seen by Blima Ledger  AGNP-C in Collaboration with Dr Lyndon Code as a part of collaborative care agreement    Dr Lyndon Code Internal medicine

## 2018-09-07 ENCOUNTER — Ambulatory Visit: Payer: BLUE CROSS/BLUE SHIELD | Admitting: Adult Health

## 2018-09-07 ENCOUNTER — Encounter: Payer: Self-pay | Admitting: Adult Health

## 2018-09-07 VITALS — BP 120/80 | HR 80 | Resp 16 | Ht 60.0 in | Wt 156.2 lb

## 2018-09-07 DIAGNOSIS — F411 Generalized anxiety disorder: Secondary | ICD-10-CM | POA: Diagnosis not present

## 2018-09-07 DIAGNOSIS — M19079 Primary osteoarthritis, unspecified ankle and foot: Secondary | ICD-10-CM | POA: Diagnosis not present

## 2018-09-07 DIAGNOSIS — Z23 Encounter for immunization: Secondary | ICD-10-CM | POA: Diagnosis not present

## 2018-09-07 MED ORDER — PHENTERMINE HCL 37.5 MG PO CAPS: 37.5000 mg | ORAL_CAPSULE | ORAL | 0 refills | Status: DC

## 2018-09-07 NOTE — Patient Instructions (Signed)

## 2018-09-07 NOTE — Progress Notes (Signed)
[facility] [address] [place], [place] [address]  Internal MEDICINE  Office Visit Note  Patient Name: Alicia Bowman  [id number]  [id number]  Date of Service: 09/07/2018  Chief Complaint  Patient presents with  . Medical Management of Chronic Issues    Weight management  . Osteoarthritis    HPI Pt here for follow up on osteoarthritis, anxiety and weight management.  She has lost 8 pounds in the last month using Phentermine and diet/exercise plan. She reports feeling great, her anxiety is well controlled.  Denies chest pain, palpitations, sob or other side effects.  She reports occasional dry mouth.     Current Medication: Outpatient Encounter Medications as of 09/07/2018  Medication Sig  . HYDROcodone-acetaminophen (NORCO) 7.5-325 MG per tablet Take 1 tablet by mouth every 6 (six) hours as needed for moderate pain.  . Linaclotide (LINZESS) 145 MCG CAPS capsule Take 1 capsule (145 mcg total) by mouth daily.  Marland Kitchen LORazepam (ATIVAN) 0.5 MG tablet Take 0.5 mg by mouth every 8 (eight) hours.  . methocarbamol (ROBAXIN) 500 MG tablet Take 500 mg by mouth 4 (four) times daily.  . phentermine 37.5 MG capsule Take 1 capsule (37.5 mg total) by mouth every morning.  . zolpidem (AMBIEN) 10 MG tablet Take 10 mg by mouth at bedtime as needed for sleep.  . [DISCONTINUED] phentermine 37.5 MG capsule Take 1 capsule (37.5 mg total) by mouth every morning.   No facility-administered encounter medications on file as of 09/07/2018.     Surgical History: Past Surgical History:  Procedure Laterality Date  . AUGMENTATION MAMMAPLASTY     saline  . BREAST SURGERY    . DILATION AND CURETTAGE OF UTERUS    . ENDOMETRIAL ABLATION    . FOOT SURGERY Bilateral   . HAND SURGERY Right   . TUBAL LIGATION      Medical History: Past Medical History:  Diagnosis Date  . Osteoarthritis     Family History: Family History  Problem Relation Age of Onset  . Cancer Mother        Unknown origin   . Cancer Father        Lung    Social History   Socioeconomic History  . Marital status: Married    Spouse name: Not on file  . Number of children: Not on file  . Years of education: Not on file  . Highest education level: Not on file  Occupational History  . Not on file  Social Needs  . Financial resource strain: Not on file  . Food insecurity:    Worry: Not on file    Inability: Not on file  . Transportation needs:    Medical: Not on file    Non-medical: Not on file  Tobacco Use  . Smoking status: Former Smoker    Last attempt to quit: 02/18/2009    Years since quitting: 9.5  . Smokeless tobacco: Never Used  Substance and Sexual Activity  . Alcohol use: Yes    Alcohol/week: 0.0 standard drinks    Comment: RARE  . Drug use: No  . Sexual activity: Yes    Birth control/protection: Surgical    Comment: 1st intercourse- 16, partners- 5--BTL  Lifestyle  . Physical activity:    Days per week: Not on file    Minutes per session: Not on file  . Stress: Not on file  Relationships  . Social connections:    Talks on phone: Not on file    Gets together: Not  on file    Attends religious service: Not on file    Active member of club or organization: Not on file    Attends meetings of clubs or organizations: Not on file    Relationship status: Not on file  . Intimate partner violence:    Fear of current or ex partner: Not on file    Emotionally abused: Not on file    Physically abused: Not on file    Forced sexual activity: Not on file  Other Topics Concern  . Not on file  Social History Narrative  . Not on file      Review of Systems  Constitutional: Negative for chills, fatigue and unexpected weight change.  HENT: Negative for congestion, rhinorrhea, sneezing and sore throat.   Eyes: Negative for photophobia, pain and redness.  Respiratory: Negative for cough, chest tightness and shortness of breath.   Cardiovascular: Negative for chest pain and palpitations.   Gastrointestinal: Negative for abdominal pain, constipation, diarrhea, nausea and vomiting.  Endocrine: Negative.   Genitourinary: Negative for dysuria and frequency.  Musculoskeletal: Negative for arthralgias, back pain, joint swelling and neck pain.  Skin: Negative for rash.  Allergic/Immunologic: Negative.   Neurological: Negative for tremors and numbness.  Hematological: Negative for adenopathy. Does not bruise/bleed easily.  Psychiatric/Behavioral: Negative for behavioral problems and sleep disturbance. The patient is not nervous/anxious.     Vital Signs: BP 120/80   Pulse 80   Resp 16   Ht 5' (1.524 m)   Wt 156 lb 3.2 oz (70.9 kg)   SpO2 99%   BMI 30.51 kg/m    Physical Exam  Constitutional: She is oriented to person, place, and time. She appears well-developed and well-nourished. No distress.  HENT:  Head: Normocephalic and atraumatic.  Mouth/Throat: Oropharynx is clear and moist. No oropharyngeal exudate.  Eyes: Pupils are equal, round, and reactive to light. EOM are normal.  Neck: Normal range of motion. Neck supple. No JVD present. No tracheal deviation present. No thyromegaly present.  Cardiovascular: Normal rate, regular rhythm and normal heart sounds. Exam reveals no gallop and no friction rub.  No murmur heard. Pulmonary/Chest: Effort normal and breath sounds normal. No respiratory distress. She has no wheezes. She has no rales. She exhibits no tenderness.  Abdominal: Soft. There is no tenderness. There is no guarding.  Musculoskeletal: Normal range of motion.  Lymphadenopathy:    She has no cervical adenopathy.  Neurological: She is alert and oriented to person, place, and time. No cranial nerve deficit.  Skin: Skin is warm and dry. She is not diaphoretic.  Psychiatric: She has a normal mood and affect. Her behavior is normal. Judgment and thought content normal.  Nursing note and vitals reviewed.  Assessment/Plan: 1. Generalized anxiety disorder Controlled  with Ativan.  Continue current therapy.   2. Localized, primary osteoarthritis of ankle or foot, unspecified laterality Currently doing well.  Managed with Robaxin and Norco.  3. Morbid obesity (HCC) - phentermine 37.5 MG capsule; Take 1 capsule (37.5 mg total) by mouth every morning.  Dispense: 30 capsule; Refill: 0 Obesity Counseling: Risk Assessment: An assessment of behavioral risk factors was made today and includes lack of exercise sedentary lifestyle, lack of portion control and poor dietary habits.  Risk Modification Advice: She was counseled on portion control guidelines. Restricting daily caloric intake to. . The detrimental long term effects of obesity on her health and ongoing poor compliance was also discussed with the patient.  There is a liability release in patients'  chart. There has been a 10 minute discussion about the side effects including but not limited to elevated blood pressure, anxiety, lack of sleep and dry mouth. Pt understands and will like to start/continue on appetite suppressant at this time. There will be one month RX given at the time of visit with proper follow up. Nova diet plan with restricted calories is given to the pt. Pt understands and agrees with  plan of treatment  4. Flu vaccine need - Flu Vaccine MDCK QUAD PF  General Counseling: Mabeline verbalizes understanding of the findings of todays visit and agrees with plan of treatment. I have discussed any further diagnostic evaluation that may be needed or ordered today. We also reviewed her medications today. she has been encouraged to call the office with any questions or concerns that should arise related to todays visit.    Orders Placed This Encounter  Procedures  . Flu Vaccine MDCK QUAD PF    Meds ordered this encounter  Medications  . phentermine 37.5 MG capsule    Sig: Take 1 capsule (37.5 mg total) by mouth every morning.    Dispense:  30 capsule    Refill:  0    Time spent: 20  Minutes   This patient was seen by Blima Ledger AGNP-C in Collaboration with Dr Lyndon Code as a part of collaborative care agreement    Blima Ledger [facility] Internal medicine

## 2018-10-05 ENCOUNTER — Encounter: Payer: Self-pay | Admitting: Adult Health

## 2018-10-05 ENCOUNTER — Ambulatory Visit: Payer: BLUE CROSS/BLUE SHIELD | Admitting: Adult Health

## 2018-10-05 VITALS — BP 135/93 | HR 73 | Resp 16 | Ht 60.0 in | Wt 153.0 lb

## 2018-10-05 DIAGNOSIS — F411 Generalized anxiety disorder: Secondary | ICD-10-CM

## 2018-10-05 DIAGNOSIS — M19079 Primary osteoarthritis, unspecified ankle and foot: Secondary | ICD-10-CM | POA: Diagnosis not present

## 2018-10-05 MED ORDER — PHENTERMINE HCL 37.5 MG PO CAPS: 37.5000 mg | ORAL_CAPSULE | ORAL | 0 refills | Status: DC

## 2018-10-05 NOTE — Progress Notes (Signed)
[facility] [address] [place], [place] [address]  Internal MEDICINE  Office Visit Note  Patient Name: Alicia Bowman  [id number]  [id number]  Date of Service: 10/05/2018  Chief Complaint  Patient presents with  . Medical Management of Chronic Issues    weight loss management    HPI  PT I here for follow up on weight loss.  She has lost 3 more pounds, for a total of 21 pounds. She denies any chest pain. Sob, palpitations or other side effects.  She is pleased with her current results and would like to continue with her weight loss.  She is traveling frequently so she worries that she is not drinking enough water.  We discussed ways to work on water intake.  Patient is unable to formally exercise due to foot injuries.  However she remains fairly active and will continue to do so.   Current Medication: Outpatient Encounter Medications as of 10/05/2018  Medication Sig  . HYDROcodone-acetaminophen (NORCO) 7.5-325 MG per tablet Take 1 tablet by mouth every 6 (six) hours as needed for moderate pain.  . Linaclotide (LINZESS) 145 MCG CAPS capsule Take 1 capsule (145 mcg total) by mouth daily.  Marland Kitchen LORazepam (ATIVAN) 0.5 MG tablet Take 0.5 mg by mouth every 8 (eight) hours.  . methocarbamol (ROBAXIN) 500 MG tablet Take 500 mg by mouth 4 (four) times daily.  . phentermine 37.5 MG capsule Take 1 capsule (37.5 mg total) by mouth every morning.  . zolpidem (AMBIEN) 10 MG tablet Take 10 mg by mouth at bedtime as needed for sleep.  . [DISCONTINUED] phentermine 37.5 MG capsule Take 1 capsule (37.5 mg total) by mouth every morning.   No facility-administered encounter medications on file as of 10/05/2018.     Surgical History: Past Surgical History:  Procedure Laterality Date  . AUGMENTATION MAMMAPLASTY     saline  . BREAST SURGERY    . DILATION AND CURETTAGE OF UTERUS    . ENDOMETRIAL ABLATION    . FOOT SURGERY Bilateral   . HAND SURGERY Right   . TUBAL LIGATION      Medical  History: Past Medical History:  Diagnosis Date  . Osteoarthritis     Family History: Family History  Problem Relation Age of Onset  . Cancer Mother        Unknown origin  . Cancer Father        Lung    Social History   Socioeconomic History  . Marital status: Married    Spouse name: Not on file  . Number of children: Not on file  . Years of education: Not on file  . Highest education level: Not on file  Occupational History  . Not on file  Social Needs  . Financial resource strain: Not on file  . Food insecurity:    Worry: Not on file    Inability: Not on file  . Transportation needs:    Medical: Not on file    Non-medical: Not on file  Tobacco Use  . Smoking status: Former Smoker    Last attempt to quit: 02/18/2009    Years since quitting: 9.6  . Smokeless tobacco: Never Used  Substance and Sexual Activity  . Alcohol use: Yes    Alcohol/week: 0.0 standard drinks    Comment: RARE  . Drug use: No  . Sexual activity: Yes    Birth control/protection: Surgical    Comment: 1st intercourse- 16, partners- 5--BTL  Lifestyle  . Physical activity:  Days per week: Not on file    Minutes per session: Not on file  . Stress: Not on file  Relationships  . Social connections:    Talks on phone: Not on file    Gets together: Not on file    Attends religious service: Not on file    Active member of club or organization: Not on file    Attends meetings of clubs or organizations: Not on file    Relationship status: Not on file  . Intimate partner violence:    Fear of current or ex partner: Not on file    Emotionally abused: Not on file    Physically abused: Not on file    Forced sexual activity: Not on file  Other Topics Concern  . Not on file  Social History Narrative  . Not on file      Review of Systems  Constitutional: Negative for chills, fatigue and unexpected weight change.  HENT: Negative for congestion, rhinorrhea, sneezing and sore throat.   Eyes:  Negative for photophobia, pain and redness.  Respiratory: Negative for cough, chest tightness and shortness of breath.   Cardiovascular: Negative for chest pain and palpitations.  Gastrointestinal: Negative for abdominal pain, constipation, diarrhea, nausea and vomiting.  Endocrine: Negative.   Genitourinary: Negative for dysuria and frequency.  Musculoskeletal: Negative for arthralgias, back pain, joint swelling and neck pain.  Skin: Negative for rash.  Allergic/Immunologic: Negative.   Neurological: Negative for tremors and numbness.  Hematological: Negative for adenopathy. Does not bruise/bleed easily.  Psychiatric/Behavioral: Negative for behavioral problems and sleep disturbance. The patient is not nervous/anxious.     Vital Signs: BP (!) 135/93 (BP Location: Right Arm, Patient Position: Sitting, Cuff Size: Small)   Pulse 73   Resp 16   Ht 5' (1.524 m)   Wt 153 lb (69.4 kg)   SpO2 98%   BMI 29.88 kg/m    Physical Exam  Constitutional: She is oriented to person, place, and time. She appears well-developed and well-nourished. No distress.  HENT:  Head: Normocephalic and atraumatic.  Mouth/Throat: Oropharynx is clear and moist. No oropharyngeal exudate.  Eyes: Pupils are equal, round, and reactive to light. EOM are normal.  Neck: Normal range of motion. Neck supple. No JVD present. No tracheal deviation present. No thyromegaly present.  Cardiovascular: Normal rate, regular rhythm and normal heart sounds. Exam reveals no gallop and no friction rub.  No murmur heard. Pulmonary/Chest: Effort normal and breath sounds normal. No respiratory distress. She has no wheezes. She has no rales. She exhibits no tenderness.  Abdominal: Soft. There is no tenderness. There is no guarding.  Musculoskeletal: Normal range of motion.  Lymphadenopathy:    She has no cervical adenopathy.  Neurological: She is alert and oriented to person, place, and time. No cranial nerve deficit.  Skin: Skin is  warm and dry. She is not diaphoretic.  Psychiatric: She has a normal mood and affect. Her behavior is normal. Judgment and thought content normal.  Nursing note and vitals reviewed.   Assessment/Plan: 1. Localized, primary osteoarthritis of ankle or foot, unspecified laterality Pt is controlled, and sees her PCP for her management of this.   2. Generalized anxiety disorder Doing well on current meds. Her pcp continues to treat this.  3. Morbid obesity (HCC) Obesity Counseling: Risk Assessment: An assessment of behavioral risk factors was made today and includes lack of exercise sedentary lifestyle, lack of portion control and poor dietary habits.  Risk Modification Advice: She was counseled  on portion control guidelines. Restricting daily caloric intake to. . The detrimental long term effects of obesity on her health and ongoing poor compliance was also discussed with the patient.  There is a liability release in patients' chart. There has been a 10 minute discussion about the side effects including but not limited to elevated blood pressure, anxiety, lack of sleep and dry mouth. Pt understands and will like to start/continue on appetite suppressant at this time. There will be one month RX given at the time of visit with proper follow up. Nova diet plan with restricted calories is given to the pt. Pt understands and agrees with  plan of treatment - phentermine 37.5 MG capsule; Take 1 capsule (37.5 mg total) by mouth every morning.  Dispense: 30 capsule; Refill: 0 Refilled Controlled medications today. Reviewed risks and possible side effects associated with taking Stimulants. Combination of these drugs with other psychotropic medications could cause dizziness and drowsiness. Pt needs to Monitor symptoms and exercise caution in driving and operating heavy machinery to avoid damages to oneself, to others and to the surroundings. Patient verbalized understanding in this matter. Dependence and  abuse for these drugs will be monitored closely. A Controlled substance policy and procedure is on file which allows [facility] to order a urine drug screen test at any visit. Patient understands and agrees with the plan..  General Counseling: Wende Mott understanding of the findings of todays visit and agrees with plan of treatment. I have discussed any further diagnostic evaluation that may be needed or ordered today. We also reviewed her medications today. she has been encouraged to call the office with any questions or concerns that should arise related to todays visit.    No orders of the defined types were placed in this encounter.   Meds ordered this encounter  Medications  . phentermine 37.5 MG capsule    Sig: Take 1 capsule (37.5 mg total) by mouth every morning.    Dispense:  30 capsule    Refill:  0    Time spent: 25  Minutes   This patient was seen by Blima Ledger AGNP-C in Collaboration with Dr Lyndon Code as a part of collaborative care agreement     Johnna Acosta AGNP-C Internal medicine

## 2018-11-02 ENCOUNTER — Encounter: Payer: Self-pay | Admitting: Adult Health

## 2018-11-02 ENCOUNTER — Ambulatory Visit: Payer: BLUE CROSS/BLUE SHIELD | Admitting: Adult Health

## 2018-11-02 VITALS — BP 126/90 | HR 95 | Resp 16 | Ht 60.5 in | Wt 150.0 lb

## 2018-11-02 DIAGNOSIS — F419 Anxiety disorder, unspecified: Secondary | ICD-10-CM | POA: Diagnosis not present

## 2018-11-02 DIAGNOSIS — M19079 Primary osteoarthritis, unspecified ankle and foot: Secondary | ICD-10-CM | POA: Diagnosis not present

## 2018-11-02 MED ORDER — PHENTERMINE HCL 37.5 MG PO CAPS: 37.5000 mg | ORAL_CAPSULE | ORAL | 0 refills | Status: DC

## 2018-11-02 NOTE — Progress Notes (Signed)
[facility] [address] [place], [place] [address]  Internal MEDICINE  Office Visit Note  Patient Name: Alicia HeirKelly Bowman  [id number]  [id number]  Date of Service: 11/02/2018  Chief Complaint  Patient presents with  . Medical Management of Chronic Issues    weight loss management    HPI   Patient is here for one-month follow-up of medical management of weight loss.  Since her last appointment she has been taking her phentermine regularly and has lost 3 pounds.  She reports she has been exercising regularly as well as watching her diet.  She denies any chest pain, shortness of breath, palpitations or any other side effects of medication.  This will be her fourth month of taking phentermine and we discussed taking a break at no more than 6 months of medication.   Current Medication: Outpatient Encounter Medications as of 11/02/2018  Medication Sig  . HYDROcodone-acetaminophen (NORCO) 7.5-325 MG per tablet Take 1 tablet by mouth every 6 (six) hours as needed for moderate pain.  . Linaclotide (LINZESS) 145 MCG CAPS capsule Take 1 capsule (145 mcg total) by mouth daily.  Marland Kitchen. LORazepam (ATIVAN) 0.5 MG tablet Take 0.5 mg by mouth every 8 (eight) hours.  . methocarbamol (ROBAXIN) 500 MG tablet Take 500 mg by mouth 4 (four) times daily.  . phentermine 37.5 MG capsule Take 1 capsule (37.5 mg total) by mouth every morning.  . zolpidem (AMBIEN) 10 MG tablet Take 10 mg by mouth at bedtime as needed for sleep.  . [DISCONTINUED] phentermine 37.5 MG capsule Take 1 capsule (37.5 mg total) by mouth every morning.   No facility-administered encounter medications on file as of 11/02/2018.     Surgical History: Past Surgical History:  Procedure Laterality Date  . AUGMENTATION MAMMAPLASTY     saline  . BREAST SURGERY    . DILATION AND CURETTAGE OF UTERUS    . ENDOMETRIAL ABLATION    . FOOT SURGERY Bilateral   . HAND SURGERY Right   . TUBAL LIGATION      Medical History: Past Medical  History:  Diagnosis Date  . Osteoarthritis     Family History: Family History  Problem Relation Age of Onset  . Cancer Mother        Unknown origin  . Cancer Father        Lung    Social History   Socioeconomic History  . Marital status: Married    Spouse name: Not on file  . Number of children: Not on file  . Years of education: Not on file  . Highest education level: Not on file  Occupational History  . Not on file  Social Needs  . Financial resource strain: Not on file  . Food insecurity:    Worry: Not on file    Inability: Not on file  . Transportation needs:    Medical: Not on file    Non-medical: Not on file  Tobacco Use  . Smoking status: Former Smoker    Last attempt to quit: 02/18/2009    Years since quitting: 9.7  . Smokeless tobacco: Never Used  Substance and Sexual Activity  . Alcohol use: Yes    Alcohol/week: 0.0 standard drinks    Comment: RARE  . Drug use: No  . Sexual activity: Yes    Birth control/protection: Surgical    Comment: 1st intercourse- 16, partners- 5--BTL  Lifestyle  . Physical activity:    Days per week: Not on file    Minutes per  session: Not on file  . Stress: Not on file  Relationships  . Social connections:    Talks on phone: Not on file    Gets together: Not on file    Attends religious service: Not on file    Active member of club or organization: Not on file    Attends meetings of clubs or organizations: Not on file    Relationship status: Not on file  . Intimate partner violence:    Fear of current or ex partner: Not on file    Emotionally abused: Not on file    Physically abused: Not on file    Forced sexual activity: Not on file  Other Topics Concern  . Not on file  Social History Narrative  . Not on file      Review of Systems  Constitutional: Negative for chills, fatigue and unexpected weight change.  HENT: Negative for congestion, rhinorrhea, sneezing and sore throat.   Eyes: Negative for photophobia,  pain and redness.  Respiratory: Negative for cough, chest tightness and shortness of breath.   Cardiovascular: Negative for chest pain and palpitations.  Gastrointestinal: Negative for abdominal pain, constipation, diarrhea, nausea and vomiting.  Endocrine: Negative.   Genitourinary: Negative for dysuria and frequency.  Musculoskeletal: Negative for arthralgias, back pain, joint swelling and neck pain.  Skin: Negative for rash.  Allergic/Immunologic: Negative.   Neurological: Negative for tremors and numbness.  Hematological: Negative for adenopathy. Does not bruise/bleed easily.  Psychiatric/Behavioral: Negative for behavioral problems and sleep disturbance. The patient is not nervous/anxious.     Vital Signs: BP 126/90   Pulse 95   Resp 16   Ht 5' 0.5" (1.537 m)   Wt 150 lb (68 kg)   SpO2 98%   BMI 28.81 kg/m    Physical Exam  Constitutional: She is oriented to person, place, and time. She appears well-developed and well-nourished. No distress.  HENT:  Head: Normocephalic and atraumatic.  Mouth/Throat: Oropharynx is clear and moist. No oropharyngeal exudate.  Eyes: Pupils are equal, round, and reactive to light. EOM are normal.  Neck: Normal range of motion. Neck supple. No JVD present. No tracheal deviation present. No thyromegaly present.  Cardiovascular: Normal rate, regular rhythm and normal heart sounds. Exam reveals no gallop and no friction rub.  No murmur heard. Pulmonary/Chest: Effort normal and breath sounds normal. No respiratory distress. She has no wheezes. She has no rales. She exhibits no tenderness.  Abdominal: Soft. There is no tenderness. There is no guarding.  Musculoskeletal: Normal range of motion.  Lymphadenopathy:    She has no cervical adenopathy.  Neurological: She is alert and oriented to person, place, and time. No cranial nerve deficit.  Skin: Skin is warm and dry. She is not diaphoretic.  Psychiatric: She has a normal mood and affect. Her  behavior is normal. Judgment and thought content normal.  Nursing note and vitals reviewed.   Assessment/Plan: 1. Anxiety Patient sees another provider for her anxiety.  She reports that it is doing well despite the fact that her ex-mother-in-law and family have moved into her home temporarily.  She continues to report that she is doing well and denies any issues.  2. Morbid obesity (HCC) Patient has lost a total of 25 pounds since beginning the medication. Obesity Counseling: Risk Assessment: An assessment of behavioral risk factors was made today and includes lack of exercise sedentary lifestyle, lack of portion control and poor dietary habits.  Risk Modification Advice: She was counseled on portion control  guidelines. Restricting daily caloric intake to. . The detrimental long term effects of obesity on her health and ongoing poor compliance was also discussed with the patient.  - phentermine 37.5 MG capsule; Take 1 capsule (37.5 mg total) by mouth every morning.  Dispense: 30 capsule; Refill: 0  There is a liability release in patients' chart. There has been a 10 minute discussion about the side effects including but not limited to elevated blood pressure, anxiety, lack of sleep and dry mouth. Pt understands and will like to start/continue on appetite suppressant at this time. There will be one month RX given at the time of visit with proper follow up. Nova diet plan with restricted calories is given to the pt. Pt understands and agrees with  plan of treatment Refilled Controlled medications today. Reviewed risks and possible side effects associated with taking Stimulants. Combination of these drugs with other psychotropic medications could cause dizziness and drowsiness. Pt needs to Monitor symptoms and exercise caution in driving and operating heavy machinery to avoid damages to oneself, to others and to the surroundings. Patient verbalized understanding in this matter. Dependence and abuse  for these drugs will be monitored closely. A Controlled substance policy and procedure is on file which allows [facility] to order a urine drug screen test at any visit. Patient understands and agrees with the plan..  3. Localized, primary osteoarthritis of ankle or foot, unspecified laterality Patient reports her ankles and feet are about the same however she does deny increased pain since losing weight.  General Counseling: Wende Mott understanding of the findings of todays visit and agrees with plan of treatment. I have discussed any further diagnostic evaluation that may be needed or ordered today. We also reviewed her medications today. she has been encouraged to call the office with any questions or concerns that should arise related to todays visit.    No orders of the defined types were placed in this encounter.   Meds ordered this encounter  Medications  . phentermine 37.5 MG capsule    Sig: Take 1 capsule (37.5 mg total) by mouth every morning.    Dispense:  30 capsule    Refill:  0    Time spent: 25 Minutes   This patient was seen by Blima Ledger AGNP-C in Collaboration with Dr Lyndon Code as a part of collaborative care agreement     Johnna Acosta AGNP-C Internal medicine

## 2018-11-02 NOTE — Patient Instructions (Signed)
Exercising to Lose Weight Exercising can help you to lose weight. In order to lose weight through exercise, you need to do vigorous-intensity exercise. You can tell that you are exercising with vigorous intensity if you are breathing very hard and fast and cannot hold a conversation while exercising. Moderate-intensity exercise helps to maintain your current weight. You can tell that you are exercising at a moderate level if you have a higher heart rate and faster breathing, but you are still able to hold a conversation. How often should I exercise? Choose an activity that you enjoy and set realistic goals. Your health care provider can help you to make an activity plan that works for you. Exercise regularly as directed by your health care provider. This may include:  Doing resistance training twice each week, such as: ? Push-ups. ? Sit-ups. ? Lifting weights. ? Using resistance bands.  Doing a given intensity of exercise for a given amount of time. Choose from these options: ? 150 minutes of moderate-intensity exercise every week. ? 75 minutes of vigorous-intensity exercise every week. ? A mix of moderate-intensity and vigorous-intensity exercise every week.  Children, pregnant women, people who are out of shape, people who are overweight, and older adults may need to consult a health care provider for individual recommendations. If you have any sort of medical condition, be sure to consult your health care provider before starting a new exercise program. What are some activities that can help me to lose weight?  Walking at a rate of at least 4.5 miles an hour.  Jogging or running at a rate of 5 miles per hour.  Biking at a rate of at least 10 miles per hour.  Lap swimming.  Roller-skating or in-line skating.  Cross-country skiing.  Vigorous competitive sports, such as football, basketball, and soccer.  Jumping rope.  Aerobic dancing. How can I be more active in my day-to-day  activities?  Use the stairs instead of the elevator.  Take a walk during your lunch break.  If you drive, park your car farther away from work or school.  If you take public transportation, get off one stop early and walk the rest of the way.  Make all of your phone calls while standing up and walking around.  Get up, stretch, and walk around every 30 minutes throughout the day. What guidelines should I follow while exercising?  Do not exercise so much that you hurt yourself, feel dizzy, or get very short of breath.  Consult your health care provider prior to starting a new exercise program.  Wear comfortable clothes and shoes with good support.  Drink plenty of water while you exercise to prevent dehydration or heat stroke. Body water is lost during exercise and must be replaced.  Work out until you breathe faster and your heart beats faster. This information is not intended to replace advice given to you by your health care provider. Make sure you discuss any questions you have with your health care provider. Document Released: 12/19/2010 Document Revised: 04/23/2016 Document Reviewed: 04/19/2014 Elsevier Interactive Patient Education  2018 [employer] Phentermine tablets or capsules What is this medicine? PHENTERMINE (FEN ter meen) decreases your appetite. It is used with a reduced calorie diet and exercise to help you lose weight. This medicine may be used for other purposes; ask your health care provider or pharmacist if you have questions. COMMON BRAND NAME(S): Adipex-P, Atti-Plex P, Atti-Plex P Spansule, Fastin, Lomaira, Pro-Fast, Tara-8 What should I tell my health care provider  before I take this medicine? They need to know if you have any of these conditions: -agitation -glaucoma -heart disease -high blood pressure -history of substance abuse -lung disease called Primary Pulmonary Hypertension (PPH) -taken an MAOI like Carbex, Eldepryl, Marplan, Nardil, or Parnate  in last 14 days -thyroid disease -an unusual or allergic reaction to phentermine, other medicines, foods, dyes, or preservatives -pregnant or trying to get pregnant -breast-feeding How should I use this medicine? Take this medicine by mouth with a glass of water. Follow the directions on the prescription label. The instructions for use may differ based on the product and dose you are taking. Avoid taking this medicine in the evening. It may interfere with sleep. Take your doses at regular intervals. Do not take your medicine more often than directed. Talk to your pediatrician regarding the use of this medicine in children. While this drug may be prescribed for children 17 years or older for selected conditions, precautions do apply. Overdosage: If you think you have taken too much of this medicine contact a poison control center or emergency room at once. NOTE: This medicine is only for you. Do not share this medicine with others. What if I miss a dose? If you miss a dose, take it as soon as you can. If it is almost time for your next dose, take only that dose. Do not take double or extra doses. What may interact with this medicine? Do not take this medicine with any of the following medications: -duloxetine -MAOIs like Carbex, Eldepryl, Marplan, Nardil, and Parnate -medicines for colds or breathing difficulties like pseudoephedrine or phenylephrine -procarbazine -sibutramine -SSRIs like citalopram, escitalopram, fluoxetine, fluvoxamine, paroxetine, and sertraline -stimulants like dexmethylphenidate, methylphenidate or modafinil -venlafaxine This medicine may also interact with the following medications: -medicines for diabetes This list may not describe all possible interactions. Give your health care provider a list of all the medicines, herbs, non-prescription drugs, or dietary supplements you use. Also tell them if you smoke, drink alcohol, or use illegal drugs. Some items may interact  with your medicine. What should I watch for while using this medicine? Notify your physician immediately if you become short of breath while doing your normal activities. Do not take this medicine within 6 hours of bedtime. It can keep you from getting to sleep. Avoid drinks that contain caffeine and try to stick to a regular bedtime every night. This medicine was intended to be used in addition to a healthy diet and exercise. The best results are achieved this way. This medicine is only indicated for short-term use. Eventually your weight loss may level out. At that point, the drug will only help you maintain your new weight. Do not increase or in any way change your dose without consulting your doctor. You may get drowsy or dizzy. Do not drive, use machinery, or do anything that needs mental alertness until you know how this medicine affects you. Do not stand or sit up quickly, especially if you are an older patient. This reduces the risk of dizzy or fainting spells. Alcohol may increase dizziness and drowsiness. Avoid alcoholic drinks. What side effects may I notice from receiving this medicine? Side effects that you should report to your doctor or health care professional as soon as possible: -chest pain, palpitations -depression or severe changes in mood -increased blood pressure -irritability -nervousness or restlessness -severe dizziness -shortness of breath -problems urinating -unusual swelling of the legs -vomiting Side effects that usually do not require medical attention (report to your  doctor or health care professional if they continue or are bothersome): -blurred vision or other eye problems -changes in sexual ability or desire -constipation or diarrhea -difficulty sleeping -dry mouth or unpleasant taste -headache -nausea This list may not describe all possible side effects. Call your doctor for medical advice about side effects. You may report side effects to FDA at  1-800-FDA-1088. Where should I keep my medicine? Keep out of the reach of children. This medicine can be abused. Keep your medicine in a safe place to protect it from theft. Do not share this medicine with anyone. Selling or giving away this medicine is dangerous and against the law. This medicine may cause accidental overdose and death if taken by other adults, children, or pets. Mix any unused medicine with a substance like cat litter or coffee grounds. Then throw the medicine away in a sealed container like a sealed bag or a coffee can with a lid. Do not use the medicine after the expiration date. Store at room temperature between 20 and 25 degrees C (68 and 77 degrees F). Keep container tightly closed. NOTE: This sheet is a summary. It may not cover all possible information. If you have questions about this medicine, talk to your doctor, pharmacist, or health care provider.  2018 Elsevier/Gold Standard (2015-08-23 12:53:15)

## 2018-11-04 DIAGNOSIS — G894 Chronic pain syndrome: Secondary | ICD-10-CM | POA: Diagnosis not present

## 2018-11-04 DIAGNOSIS — E7800 Pure hypercholesterolemia, unspecified: Secondary | ICD-10-CM | POA: Diagnosis not present

## 2018-11-04 DIAGNOSIS — F5101 Primary insomnia: Secondary | ICD-10-CM | POA: Diagnosis not present

## 2018-11-04 DIAGNOSIS — F419 Anxiety disorder, unspecified: Secondary | ICD-10-CM | POA: Diagnosis not present

## 2018-11-17 ENCOUNTER — Other Ambulatory Visit: Payer: Self-pay

## 2018-11-17 ENCOUNTER — Telehealth: Payer: Self-pay

## 2018-11-17 MED ORDER — PHENTERMINE HCL 37.5 MG PO TABS: 37.5000 mg | ORAL_TABLET | Freq: Every day | ORAL | 0 refills | Status: DC

## 2018-11-17 NOTE — Telephone Encounter (Signed)
PT PHARMACY CALLED AND SAID PHENTERMINE CAPSULES ARE ON BACK ORDER. PHARMACY CALLED TO SEE IF THE PROVIDER IS OKAY WITH CHANGING THE CAPSULES TO TABLETS INSTEAD AND PATIENT IS OKAY WITH IT. SPOKE WITH ADAM AND HE SAID THAT IS OKAY WITH HIM. l LET THE REPRESENTATIVE KNOW THAT I SPOKE WITH THE PROVIDER AND HE SAID THAT IS OKAY.

## 2018-12-06 ENCOUNTER — Ambulatory Visit: Payer: Self-pay | Admitting: Nurse Practitioner

## 2018-12-27 ENCOUNTER — Other Ambulatory Visit: Payer: Self-pay | Admitting: Family Medicine

## 2018-12-27 DIAGNOSIS — I6521 Occlusion and stenosis of right carotid artery: Secondary | ICD-10-CM

## 2018-12-28 ENCOUNTER — Ambulatory Visit: Payer: BC Managed Care – PPO | Admitting: Adult Health

## 2019-01-02 ENCOUNTER — Encounter: Payer: BLUE CROSS/BLUE SHIELD | Admitting: Gynecology

## 2019-01-06 ENCOUNTER — Ambulatory Visit
Admission: RE | Admit: 2019-01-06 | Discharge: 2019-01-06 | Disposition: A | Discharge status: Home / Self Care | Payer: BC Managed Care – PPO | Source: Ambulatory Visit | Attending: Family Medicine | Admitting: Family Medicine

## 2019-01-06 DIAGNOSIS — I6523 Occlusion and stenosis of bilateral carotid arteries: Secondary | ICD-10-CM | POA: Diagnosis not present

## 2019-01-06 DIAGNOSIS — I6521 Occlusion and stenosis of right carotid artery: Secondary | ICD-10-CM

## 2019-01-10 DIAGNOSIS — J111 Influenza due to unidentified influenza virus with other respiratory manifestations: Secondary | ICD-10-CM | POA: Diagnosis not present

## 2019-01-11 ENCOUNTER — Ambulatory Visit: Payer: Self-pay | Admitting: Adult Health

## 2019-01-25 ENCOUNTER — Encounter: Payer: Self-pay | Admitting: Adult Health

## 2019-01-25 ENCOUNTER — Ambulatory Visit: Payer: BC Managed Care – PPO | Admitting: Adult Health

## 2019-01-25 VITALS — BP 112/78 | HR 79 | Resp 16 | Ht 60.0 in | Wt 138.0 lb

## 2019-01-25 DIAGNOSIS — R635 Abnormal weight gain: Secondary | ICD-10-CM | POA: Diagnosis not present

## 2019-01-25 DIAGNOSIS — F411 Generalized anxiety disorder: Secondary | ICD-10-CM

## 2019-01-25 MED ORDER — PHENTERMINE HCL 37.5 MG PO TABS: 37.5000 mg | ORAL_TABLET | Freq: Every day | ORAL | 0 refills | Status: DC

## 2019-01-25 NOTE — Patient Instructions (Signed)
Phentermine tablets or capsules What is this medicine? PHENTERMINE (FEN ter meen) decreases your appetite. It is used with a reduced calorie diet and exercise to help you lose weight. This medicine may be used for other purposes; ask your health care provider or pharmacist if you have questions. COMMON BRAND NAME(S): Adipex-P, Atti-Plex P, Atti-Plex P Spansule, Fastin, Lomaira, Pro-Fast, Tara-8 What should I tell my health care provider before I take this medicine? They need to know if you have any of these conditions: -agitation or nervousness -diabetes -glaucoma -heart disease -high blood pressure -history of drug abuse or addiction -history of stroke -kidney disease -lung disease called Primary Pulmonary Hypertension (PPH) -taken an MAOI like Carbex, Eldepryl, Marplan, Nardil, or Parnate in last 14 days -taking stimulant medicines for attention disorders, weight loss, or to stay awake -thyroid disease -an unusual or allergic reaction to phentermine, other medicines, foods, dyes, or preservatives -pregnant or trying to get pregnant -breast-feeding How should I use this medicine? Take this medicine by mouth with a glass of water. Follow the directions on the prescription label. The instructions for use may differ based on the product and dose you are taking. Avoid taking this medicine in the evening. It may interfere with sleep. Take your doses at regular intervals. Do not take your medicine more often than directed. Talk to your pediatrician regarding the use of this medicine in children. While this drug may be prescribed for children 17 years or older for selected conditions, precautions do apply. Overdosage: If you think you have taken too much of this medicine contact a poison control center or emergency room at once. NOTE: This medicine is only for you. Do not share this medicine with others. What if I miss a dose? If you miss a dose, take it as soon as you can. If it is almost time  for your next dose, take only that dose. Do not take double or extra doses. What may interact with this medicine? Do not take this medicine with any of the following medications: -MAOIs like Carbex, Eldepryl, Marplan, Nardil, and Parnate -medicines for colds or breathing difficulties like pseudoephedrine or phenylephrine -procarbazine -sibutramine -stimulant medicines for attention disorders, weight loss, or to stay awake This medicine may also interact with the following medications: -certain medicines for depression, anxiety, or psychotic disturbances -linezolid -medicines for diabetes -medicines for high blood pressure This list may not describe all possible interactions. Give your health care provider a list of all the medicines, herbs, non-prescription drugs, or dietary supplements you use. Also tell them if you smoke, drink alcohol, or use illegal drugs. Some items may interact with your medicine. What should I watch for while using this medicine? Notify your physician immediately if you become short of breath while doing your normal activities. Do not take this medicine within 6 hours of bedtime. It can keep you from getting to sleep. Avoid drinks that contain caffeine and try to stick to a regular bedtime every night. This medicine was intended to be used in addition to a healthy diet and exercise. The best results are achieved this way. This medicine is only indicated for short-term use. Eventually your weight loss may level out. At that point, the drug will only help you maintain your new weight. Do not increase or in any way change your dose without consulting your doctor. You may get drowsy or dizzy. Do not drive, use machinery, or do anything that needs mental alertness until you know how this medicine affects you. Do   not stand or sit up quickly, especially if you are an older patient. This reduces the risk of dizzy or fainting spells. Alcohol may increase dizziness and drowsiness.  Avoid alcoholic drinks. What side effects may I notice from receiving this medicine? Side effects that you should report to your doctor or health care professional as soon as possible: -allergic reactions like skin rash, itching or hives, swelling of the face, lips, or tongue) -anxiety -breathing problems -changes in vision -chest pain or chest tightness -depressed mood or other mood changes -hallucinations, loss of contact with reality -fast, irregular heartbeat -increased blood pressure -irritable -nervousness or restlessness -painful urination -palpitations -tremors -trouble sleeping -seizures -signs and symptoms of a stroke like changes in vision; confusion; trouble speaking or understanding; severe headaches; sudden numbness or weakness of the face, arm or leg; trouble walking; dizziness; loss of balance or coordination -unusually weak or tired -vomiting Side effects that usually do not require medical attention (report to your doctor or health care professional if they continue or are bothersome): -constipation or diarrhea -dry mouth -headache -nausea -stomach upset -sweating This list may not describe all possible side effects. Call your doctor for medical advice about side effects. You may report side effects to FDA at 1-800-FDA-1088. Where should I keep my medicine? Keep out of the reach of children. This medicine can be abused. Keep your medicine in a safe place to protect it from theft. Do not share this medicine with anyone. Selling or giving away this medicine is dangerous and against the law. This medicine may cause accidental overdose and death if taken by other adults, children, or pets. Mix any unused medicine with a substance like cat litter or coffee grounds. Then throw the medicine away in a sealed container like a sealed bag or a coffee can with a lid. Do not use the medicine after the expiration date. Store at room temperature between 20 and 25 degrees C (68 and  77 degrees F). Keep container tightly closed. NOTE: This sheet is a summary. It may not cover all possible information. If you have questions about this medicine, talk to your doctor, pharmacist, or health care provider.  2019 Elsevier/Gold Standard (2017-04-30 08:23:13)  

## 2019-01-25 NOTE — Progress Notes (Signed)
[facility] [address] [place], [place] [address]  Internal MEDICINE  Office Visit Note  Patient Name: Alicia Bowman  [id number]  [id number]  Date of Service: 01/25/2019  Chief Complaint  Patient presents with  . Medical Management of Chronic Issues    weight loss management     HPI  Pt is here for follow up on medical management of weight loss.  Patient is using phentermine for weight loss.  Since our last visit they have lost 12 pounds.  The patient denies any chest pain, palpitations, shortness of breath, constipation, headaches or any other side effects of the medication.  Patient wishes to continue to use this medication for weight loss at this time.  She reports she is taking the phentermine occasionally, and does not feel the need to take it daily.  She has now lost a total of 36 pounds.    Current Medication: Outpatient Encounter Medications as of 01/25/2019  Medication Sig  . HYDROcodone-acetaminophen (NORCO) 7.5-325 MG per tablet Take 1 tablet by mouth every 6 (six) hours as needed for moderate pain.  . Linaclotide (LINZESS) 145 MCG CAPS capsule Take 1 capsule (145 mcg total) by mouth daily.  Marland Kitchen LORazepam (ATIVAN) 0.5 MG tablet Take 0.5 mg by mouth every 8 (eight) hours.  . methocarbamol (ROBAXIN) 500 MG tablet Take 500 mg by mouth 4 (four) times daily.  . phentermine (ADIPEX-P) 37.5 MG tablet Take 1 tablet (37.5 mg total) by mouth daily before breakfast.  . zolpidem (AMBIEN) 10 MG tablet Take 10 mg by mouth at bedtime as needed for sleep.  . [DISCONTINUED] phentermine (ADIPEX-P) 37.5 MG tablet Take 1 tablet (37.5 mg total) by mouth daily before breakfast.  . [DISCONTINUED] phentermine (ADIPEX-P) 37.5 MG tablet Take 1 tablet (37.5 mg total) by mouth daily before breakfast.   No facility-administered encounter medications on file as of 01/25/2019.     Surgical History: Past Surgical History:  Procedure Laterality Date  . AUGMENTATION MAMMAPLASTY     saline   . BREAST SURGERY    . DILATION AND CURETTAGE OF UTERUS    . ENDOMETRIAL ABLATION    . FOOT SURGERY Bilateral   . HAND SURGERY Right   . TUBAL LIGATION      Medical History: Past Medical History:  Diagnosis Date  . Osteoarthritis     Family History: Family History  Problem Relation Age of Onset  . Cancer Mother        Unknown origin  . Cancer Father        Lung    Social History   Socioeconomic History  . Marital status: Married    Spouse name: Not on file  . Number of children: Not on file  . Years of education: Not on file  . Highest education level: Not on file  Occupational History  . Not on file  Social Needs  . Financial resource strain: Not on file  . Food insecurity:    Worry: Not on file    Inability: Not on file  . Transportation needs:    Medical: Not on file    Non-medical: Not on file  Tobacco Use  . Smoking status: Former Smoker    Last attempt to quit: 02/18/2009    Years since quitting: 9.9  . Smokeless tobacco: Never Used  Substance and Sexual Activity  . Alcohol use: Yes    Alcohol/week: 0.0 standard drinks    Comment: RARE  . Drug use: No  . Sexual activity:  Yes    Birth control/protection: Surgical    Comment: 1st intercourse- 16, partners- 5--BTL  Lifestyle  . Physical activity:    Days per week: Not on file    Minutes per session: Not on file  . Stress: Not on file  Relationships  . Social connections:    Talks on phone: Not on file    Gets together: Not on file    Attends religious service: Not on file    Active member of club or organization: Not on file    Attends meetings of clubs or organizations: Not on file    Relationship status: Not on file  . Intimate partner violence:    Fear of current or ex partner: Not on file    Emotionally abused: Not on file    Physically abused: Not on file    Forced sexual activity: Not on file  Other Topics Concern  . Not on file  Social History Narrative  . Not on file       Review of Systems  Constitutional: Negative for chills, fatigue and unexpected weight change.  HENT: Negative for congestion, rhinorrhea, sneezing and sore throat.   Eyes: Negative for photophobia, pain and redness.  Respiratory: Negative for cough, chest tightness and shortness of breath.   Cardiovascular: Negative for chest pain and palpitations.  Gastrointestinal: Negative for abdominal pain, constipation, diarrhea, nausea and vomiting.  Endocrine: Negative.   Genitourinary: Negative for dysuria and frequency.  Musculoskeletal: Negative for arthralgias, back pain, joint swelling and neck pain.  Skin: Negative for rash.  Allergic/Immunologic: Negative.   Neurological: Negative for tremors and numbness.  Hematological: Negative for adenopathy. Does not bruise/bleed easily.  Psychiatric/Behavioral: Negative for behavioral problems and sleep disturbance. The patient is not nervous/anxious.     Vital Signs: BP 112/78   Pulse 79   Resp 16   Ht 5' (1.524 m)   Wt 138 lb (62.6 kg)   SpO2 98%   BMI 26.95 kg/m    Physical Exam Vitals signs and nursing note reviewed.  Constitutional:      General: She is not in acute distress.    Appearance: She is well-developed. She is not diaphoretic.  HENT:     Head: Normocephalic and atraumatic.     Mouth/Throat:     Pharynx: No oropharyngeal exudate.  Eyes:     Pupils: Pupils are equal, round, and reactive to light.  Neck:     Musculoskeletal: Normal range of motion and neck supple.     Thyroid: No thyromegaly.     Vascular: No JVD.     Trachea: No tracheal deviation.  Cardiovascular:     Rate and Rhythm: Normal rate and regular rhythm.     Heart sounds: Normal heart sounds. No murmur. No friction rub. No gallop.   Pulmonary:     Effort: Pulmonary effort is normal. No respiratory distress.     Breath sounds: Normal breath sounds. No wheezing or rales.  Chest:     Chest wall: No tenderness.  Abdominal:     Palpations:  Abdomen is soft.     Tenderness: There is no abdominal tenderness. There is no guarding.  Musculoskeletal: Normal range of motion.  Lymphadenopathy:     Cervical: No cervical adenopathy.  Skin:    General: Skin is warm and dry.  Neurological:     Mental Status: She is alert and oriented to person, place, and time.     Cranial Nerves: No cranial nerve deficit.  Psychiatric:  Behavior: Behavior normal.        Thought Content: Thought content normal.        Judgment: Judgment normal.     Assessment/Plan: 1. Generalized anxiety disorder Pt is doing well.  Reports she is sleeping well, and denies any current issues.   2. Weight gain Obesity Counseling: Risk Assessment: An assessment of behavioral risk factors was made today and includes lack of exercise sedentary lifestyle, lack of portion control and poor dietary habits.  Risk Modification Advice: She was counseled on portion control guidelines. Restricting daily caloric intake to. . The detrimental long term effects of obesity on her health and ongoing poor compliance was also discussed with the patient.  There is a liability release in patients' chart. There has been a 10 minute discussion about the side effects including but not limited to elevated blood pressure, anxiety, lack of sleep and dry mouth. Pt understands and will like to start/continue on appetite suppressant at this time. There will be one month RX given at the time of visit with proper follow up. Nova diet plan with restricted calories is given to the pt. Pt understands and agrees with  plan of treatment  - phentermine (ADIPEX-P) 37.5 MG tablet; Take 1 tablet (37.5 mg total) by mouth daily before breakfast.  Dispense: 30 tablet; Refill: 0  General Counseling: Tresa Endo verbalizes understanding of the findings of todays visit and agrees with plan of treatment. I have discussed any further diagnostic evaluation that may be needed or ordered today. We also reviewed her  medications today. she has been encouraged to call the office with any questions or concerns that should arise related to todays visit.    No orders of the defined types were placed in this encounter.   Meds ordered this encounter  Medications  . DISCONTD: phentermine (ADIPEX-P) 37.5 MG tablet    Sig: Take 1 tablet (37.5 mg total) by mouth daily before breakfast.    Dispense:  30 tablet    Refill:  0  . phentermine (ADIPEX-P) 37.5 MG tablet    Sig: Take 1 tablet (37.5 mg total) by mouth daily before breakfast.    Dispense:  30 tablet    Refill:  0    Time spent: 25 Minutes   This patient was seen by Blima Ledger AGNP-C in Collaboration with Dr Lyndon Code as a part of collaborative care agreement     Johnna Acosta AGNP-C Internal medicine

## 2019-01-27 ENCOUNTER — Ambulatory Visit (INDEPENDENT_AMBULATORY_CARE_PROVIDER_SITE_OTHER): Payer: BLUE CROSS/BLUE SHIELD | Admitting: Gynecology

## 2019-01-27 ENCOUNTER — Encounter: Payer: Self-pay | Admitting: Gynecology

## 2019-01-27 VITALS — BP 110/70 | Ht 59.0 in | Wt 139.0 lb

## 2019-01-27 DIAGNOSIS — Z01419 Encounter for gynecological examination (general) (routine) without abnormal findings: Secondary | ICD-10-CM

## 2019-01-27 NOTE — Patient Instructions (Signed)
Schedule your colonoscopy with either:  [facility]   Address: [address], [place], [place] [address]  [phone]    or  [facility]  Address: [address], [place], [place] [address]  [phone]       Call to Schedule your mammogram  Facilities in [place]: 1)  [facility], [address] Phone: [phone] 2)  Dr. Bertrand at [facility]  [address] Phone: [phone]     Mammogram A mammogram is an X-ray test to find changes in a woman's breast. You should get a mammogram if:  You are 40 years of age or older  You have risk factors.   Your doctor recommends that you have one.  BEFORE THE TEST  Do not schedule the test the week before your period, especially if your breasts are sore during this time.  On the day of your mammogram:  Wash your breasts and armpits well. After washing, do not put on any deodorant or talcum powder on until after your test.   Eat and drink as you usually do.   Take your medicines as usual.   If you are diabetic and take insulin, make sure you:   Eat before coming for your test.   Take your insulin as usual.   If you cannot keep your appointment, call before the appointment to cancel. Schedule another appointment.  TEST  You will need to undress from the waist up. You will put on a hospital gown.   Your breast will be put on the mammogram machine, and it will press firmly on your breast with a piece of plastic called a compression paddle. This will make your breast flatter so that the machine can X-ray all parts of your breast.   Both breasts will be X-rayed. Each breast will be X-rayed from above and from the side. An X-ray might need to be taken again if the picture is not good enough.   The mammogram will last about 15 to 30 minutes.  AFTER THE TEST Finding out the results of your test Ask when  your test results will be ready. Make sure you get your test results.  Document Released: 02/12/2009 Document Revised: 11/05/2011 Document Reviewed: 02/12/2009 Exit[employer] Patient Information 2012 [employer].   

## 2019-01-27 NOTE — Progress Notes (Signed)
    MONEY MADILL 03/17/1966 [id number]        53 y.o.  G3P3 for annual gynecologic exam.  Without gynecologic complaints  Past medical history,surgical history, problem list, medications, allergies, family history and social history were all reviewed and documented as reviewed in the EPIC chart.  ROS:  Performed with pertinent positives and negatives included in the history, assessment and plan.   Additional significant findings : None   Exam: Kennon Portela assistant Vitals:   01/27/19 1455  BP: 110/70  Weight: 139 lb (63 kg)  Height: 4\' 11"  (1.499 m)   Body mass index is 28.07 kg/m.  General appearance:  Normal affect, orientation and appearance. Skin: Grossly normal HEENT: Without gross lesions.  No cervical or supraclavicular adenopathy. Thyroid normal.  Lungs:  Clear without wheezing, rales or rhonchi Cardiac: RR, without RMG Abdominal:  Soft, nontender, without masses, guarding, rebound, organomegaly or hernia Breasts:  Examined lying and sitting without masses, retractions, discharge or axillary adenopathy. Pelvic:  Ext, BUS, Vagina: Normal  Cervix: Normal  Uterus: Anteverted, normal size, shape and contour, midline and mobile nontender   Adnexa: Without masses or tenderness    Anus and perineum: Normal   Rectovaginal: Normal sphincter tone without palpated masses or tenderness.    Assessment/Plan:  53 y.o. G3P3 female for annual gynecologic exam.  With monthly menses, tubal sterilization  1. Perimenopause.  Continues with light monthly menses.  No significant hot flushes or sweats. 2. Mammogram a number of years ago.  I again strongly recommended the patient schedule a screening mammogram.  Most common cancer in women reviewed.  Benefits of early detection.  Names and numbers provided for her to call and schedule.  Breast exam normal today. 3. Pap smear 10/2017.  No Pap smear done today.  No history of abnormal Pap smears.  Plan repeat Pap smear at 3-year interval  per current screening guidelines. 4. Colonoscopy never.  Second most common cancer in women reviewed.  Recommended screening colonoscopy.  Names and numbers provided. 5. Health maintenance.  No routine lab work done as patient does this elsewhere.  Follow-up 1 year, sooner as needed.   Dara Lords MD, 3:15 PM 01/27/2019

## 2019-01-31 DIAGNOSIS — H16223 Keratoconjunctivitis sicca, not specified as Sjogren's, bilateral: Secondary | ICD-10-CM | POA: Diagnosis not present

## 2019-01-31 DIAGNOSIS — H0102A Squamous blepharitis right eye, upper and lower eyelids: Secondary | ICD-10-CM | POA: Diagnosis not present

## 2019-01-31 DIAGNOSIS — H1013 Acute atopic conjunctivitis, bilateral: Secondary | ICD-10-CM | POA: Diagnosis not present

## 2019-01-31 DIAGNOSIS — H0102B Squamous blepharitis left eye, upper and lower eyelids: Secondary | ICD-10-CM | POA: Diagnosis not present

## 2019-02-22 ENCOUNTER — Ambulatory Visit: Payer: BC Managed Care – PPO | Admitting: Nurse Practitioner

## 2019-02-22 ENCOUNTER — Other Ambulatory Visit: Payer: Self-pay

## 2019-02-22 ENCOUNTER — Encounter: Payer: Self-pay | Admitting: Nurse Practitioner

## 2019-02-22 VITALS — BP 118/78 | HR 72 | Resp 16 | Ht 60.0 in | Wt 140.0 lb

## 2019-02-22 DIAGNOSIS — Z6827 Body mass index (BMI) 27.0-27.9, adult: Secondary | ICD-10-CM

## 2019-02-22 MED ORDER — PHENTERMINE HCL 37.5 MG PO TABS: 37.5000 mg | ORAL_TABLET | Freq: Every day | ORAL | 0 refills | Status: DC

## 2019-02-22 NOTE — Progress Notes (Signed)
[facility] [address] [place], [place] [address]  Internal MEDICINE  Office Visit Note  Patient Name: Alicia Bowman  [id number]  [id number]  Date of Service: 02/22/2019  Chief Complaint  Patient presents with  . Medical Management of Chronic Issues    4 week follow up weight management     Pt is here for follow up on medical management of weight loss.  Patient is using phentermine for weight loss.  Has had first weight gain of 2 pounds since her last visit. She has lost a total of 34 pounds since starting on phentermin.  The patient denies any chest pain, palpitations, shortness of breath, constipation, headaches or any other side effects of the medication.  Patient wishes to continue to use this medication for weight loss at this time.  She reports she is taking the phentermine occasionally, and does not feel the need to take it daily.        Current Medication: Outpatient Encounter Medications as of 02/22/2019  Medication Sig  . HYDROcodone-acetaminophen (NORCO) 7.5-325 MG per tablet Take 1 tablet by mouth every 6 (six) hours as needed for moderate pain.  . Linaclotide (LINZESS) 145 MCG CAPS capsule Take 1 capsule (145 mcg total) by mouth daily.  Marland Kitchen LORazepam (ATIVAN) 0.5 MG tablet Take 0.5 mg by mouth every 8 (eight) hours.  . methocarbamol (ROBAXIN) 500 MG tablet Take 500 mg by mouth 4 (four) times daily.  . phentermine (ADIPEX-P) 37.5 MG tablet Take 1 tablet (37.5 mg total) by mouth daily before breakfast.  . [DISCONTINUED] phentermine (ADIPEX-P) 37.5 MG tablet Take 1 tablet (37.5 mg total) by mouth daily before breakfast.   No facility-administered encounter medications on file as of 02/22/2019.     Surgical History: Past Surgical History:  Procedure Laterality Date  . AUGMENTATION MAMMAPLASTY     saline  . BREAST SURGERY    . DILATION AND CURETTAGE OF UTERUS    . ENDOMETRIAL ABLATION    . FOOT SURGERY Bilateral   . HAND SURGERY Right   . TUBAL LIGATION       Medical History: Past Medical History:  Diagnosis Date  . Osteoarthritis     Family History: Family History  Problem Relation Age of Onset  . Cancer Mother        Unknown origin  . Cancer Father        Lung    Social History   Socioeconomic History  . Marital status: Married    Spouse name: Not on file  . Number of children: Not on file  . Years of education: Not on file  . Highest education level: Not on file  Occupational History  . Not on file  Social Needs  . Financial resource strain: Not on file  . Food insecurity:    Worry: Not on file    Inability: Not on file  . Transportation needs:    Medical: Not on file    Non-medical: Not on file  Tobacco Use  . Smoking status: Former Smoker    Last attempt to quit: 02/18/2009    Years since quitting: 10.0  . Smokeless tobacco: Never Used  Substance and Sexual Activity  . Alcohol use: Yes    Alcohol/week: 0.0 standard drinks    Comment: RARE  . Drug use: No  . Sexual activity: Yes    Birth control/protection: Surgical    Comment: 1st intercourse- 16, partners- 5--BTL  Lifestyle  . Physical activity:    Days per  week: Not on file    Minutes per session: Not on file  . Stress: Not on file  Relationships  . Social connections:    Talks on phone: Not on file    Gets together: Not on file    Attends religious service: Not on file    Active member of club or organization: Not on file    Attends meetings of clubs or organizations: Not on file    Relationship status: Not on file  . Intimate partner violence:    Fear of current or ex partner: Not on file    Emotionally abused: Not on file    Physically abused: Not on file    Forced sexual activity: Not on file  Other Topics Concern  . Not on file  Social History Narrative  . Not on file      Review of Systems  Constitutional: Negative for chills, fatigue and unexpected weight change.       Two pound weight gain since last visit.   HENT: Negative  for congestion, rhinorrhea, sneezing and sore throat.   Respiratory: Negative for cough, chest tightness and shortness of breath.   Cardiovascular: Negative for chest pain and palpitations.  Gastrointestinal: Negative for abdominal pain, constipation, diarrhea, nausea and vomiting.  Skin: Negative for rash.  Neurological: Negative for tremors and numbness.  Hematological: Negative for adenopathy. Does not bruise/bleed easily.  Psychiatric/Behavioral: Negative for behavioral problems and sleep disturbance. The patient is not nervous/anxious.     Today's Vitals   02/22/19 1118  BP: 118/78  Pulse: 72  Resp: 16  SpO2: 100%  Weight: 140 lb (63.5 kg)  Height: 5' (1.524 m)   Body mass index is 27.34 kg/m.  Physical Exam Vitals signs and nursing note reviewed.  Constitutional:      General: She is not in acute distress.    Appearance: Normal appearance. She is well-developed. She is not diaphoretic.  HENT:     Head: Normocephalic and atraumatic.     Mouth/Throat:     Pharynx: No oropharyngeal exudate.  Eyes:     Pupils: Pupils are equal, round, and reactive to light.  Neck:     Musculoskeletal: Normal range of motion and neck supple.     Thyroid: No thyromegaly.     Vascular: No JVD.     Trachea: No tracheal deviation.  Cardiovascular:     Rate and Rhythm: Normal rate and regular rhythm.     Heart sounds: Normal heart sounds. No murmur. No friction rub. No gallop.   Pulmonary:     Effort: Pulmonary effort is normal. No respiratory distress.     Breath sounds: Normal breath sounds. No wheezing or rales.  Chest:     Chest wall: No tenderness.  Abdominal:     Tenderness: There is no abdominal tenderness. There is no guarding.  Musculoskeletal: Normal range of motion.  Lymphadenopathy:     Cervical: No cervical adenopathy.  Skin:    General: Skin is warm and dry.  Neurological:     Mental Status: She is alert and oriented to person, place, and time.     Cranial Nerves: No  cranial nerve deficit.  Psychiatric:        Behavior: Behavior normal.        Thought Content: Thought content normal.        Judgment: Judgment normal.   Assessment/Plan:  1. BMI 27.0-27.9,adult Overall, continues to do well. Has lost 34 pounds alogethe since starting weight loss program.  Continue phentermine 37.5mg  tablets daily. Limit calorie intake to 1200 calories per day. Incorporate exercise into daily routine as tolerated . - phentermine (ADIPEX-P) 37.5 MG tablet; Take 1 tablet (37.5 mg total) by mouth daily before breakfast.  Dispense: 30 tablet; Refill: 0  General Counseling: Tresa Endo verbalizes understanding of the findings of todays visit and agrees with plan of treatment. I have discussed any further diagnostic evaluation that may be needed or ordered today. We also reviewed her medications today. she has been encouraged to call the office with any questions or concerns that should arise related to todays visit.   There is a liability release in patients' chart. There has been a 10 minute discussion about the side effects including but not limited to elevated blood pressure, anxiety, lack of sleep and dry mouth. Pt understands and will like to start/continue on appetite suppressant at this time. There will be one month RX given at the time of visit with proper follow up. Nova diet plan with restricted calories is given to the pt. Pt understands and agrees with  plan of treatment  This patient was seen by Vincent Gros FNP Collaboration with Dr Lyndon Code as a part of collaborative care agreement  Meds ordered this encounter  Medications  . phentermine (ADIPEX-P) 37.5 MG tablet    Sig: Take 1 tablet (37.5 mg total) by mouth daily before breakfast.    Dispense:  30 tablet    Refill:  0    Order Specific Question:   Supervising Provider    Answer:   Lyndon Code [[id number]]    Time spent: 81 Minutes      Dr Lyndon Code Internal medicine

## 2019-03-22 ENCOUNTER — Ambulatory Visit: Payer: BLUE CROSS/BLUE SHIELD | Admitting: Adult Health

## 2019-03-22 ENCOUNTER — Ambulatory Visit: Payer: BC Managed Care – PPO | Admitting: Nurse Practitioner

## 2019-03-22 DIAGNOSIS — Z6827 Body mass index (BMI) 27.0-27.9, adult: Secondary | ICD-10-CM | POA: Diagnosis not present

## 2019-03-22 MED ORDER — PHENTERMINE HCL 37.5 MG PO TABS: 37.5000 mg | ORAL_TABLET | Freq: Every day | ORAL | 1 refills | Status: DC

## 2019-03-22 NOTE — Progress Notes (Signed)
[facility] [address] [place], [place] [address]  Internal MEDICINE  Office Visit Note  Patient Name: Alicia Bowman  [id number]  [id number]  Date of Service: 04/02/2019  Chief Complaint  Patient presents with  . Medical Management of Chronic Issues    4 week follow weight management     Pt is here for follow up on medical management of weight loss.  Patient is using phentermine for weight loss.  Has had first weight gain of 1 pounds since her last visit. She has lost a total of 33 pounds since starting on phentermin.  The patient denies any chest pain, palpitations, shortness of breath, constipation, headaches or any other side effects of the medication.  Patient wishes to continue to use this medication for weight loss at this time.  She reports she is taking the phentermine occasionally, and does not feel the need to take it daily.        Current Medication: Outpatient Encounter Medications as of 03/22/2019  Medication Sig  . HYDROcodone-acetaminophen (NORCO) 7.5-325 MG per tablet Take 1 tablet by mouth every 6 (six) hours as needed for moderate pain.  . Linaclotide (LINZESS) 145 MCG CAPS capsule Take 1 capsule (145 mcg total) by mouth daily.  Marland Kitchen LORazepam (ATIVAN) 0.5 MG tablet Take 0.5 mg by mouth every 8 (eight) hours.  . methocarbamol (ROBAXIN) 500 MG tablet Take 500 mg by mouth 4 (four) times daily.  . phentermine (ADIPEX-P) 37.5 MG tablet Take 1 tablet (37.5 mg total) by mouth daily before breakfast.  . [DISCONTINUED] phentermine (ADIPEX-P) 37.5 MG tablet Take 1 tablet (37.5 mg total) by mouth daily before breakfast.   No facility-administered encounter medications on file as of 03/22/2019.     Surgical History: Past Surgical History:  Procedure Laterality Date  . AUGMENTATION MAMMAPLASTY     saline  . BREAST SURGERY    . DILATION AND CURETTAGE OF UTERUS    . ENDOMETRIAL ABLATION    . FOOT SURGERY Bilateral   . HAND SURGERY Right   . TUBAL LIGATION       Medical History: Past Medical History:  Diagnosis Date  . Osteoarthritis     Family History: Family History  Problem Relation Age of Onset  . Cancer Mother        Unknown origin  . Cancer Father        Lung    Social History   Socioeconomic History  . Marital status: Married    Spouse name: Not on file  . Number of children: Not on file  . Years of education: Not on file  . Highest education level: Not on file  Occupational History  . Not on file  Social Needs  . Financial resource strain: Not on file  . Food insecurity:    Worry: Not on file    Inability: Not on file  . Transportation needs:    Medical: Not on file    Non-medical: Not on file  Tobacco Use  . Smoking status: Former Smoker    Last attempt to quit: 02/18/2009    Years since quitting: 10.1  . Smokeless tobacco: Never Used  Substance and Sexual Activity  . Alcohol use: Yes    Alcohol/week: 0.0 standard drinks    Comment: RARE  . Drug use: No  . Sexual activity: Yes    Birth control/protection: Surgical    Comment: 1st intercourse- 16, partners- 5--BTL  Lifestyle  . Physical activity:    Days per week:  Not on file    Minutes per session: Not on file  . Stress: Not on file  Relationships  . Social connections:    Talks on phone: Not on file    Gets together: Not on file    Attends religious service: Not on file    Active member of club or organization: Not on file    Attends meetings of clubs or organizations: Not on file    Relationship status: Not on file  . Intimate partner violence:    Fear of current or ex partner: Not on file    Emotionally abused: Not on file    Physically abused: Not on file    Forced sexual activity: Not on file  Other Topics Concern  . Not on file  Social History Narrative  . Not on file      Review of Systems  Constitutional: Negative for chills, fatigue and unexpected weight change.       One pound weight gain since last visit.   HENT: Negative for  congestion, rhinorrhea, sneezing and sore throat.   Respiratory: Negative for cough, chest tightness and shortness of breath.   Cardiovascular: Negative for chest pain and palpitations.  Gastrointestinal: Negative for abdominal pain, constipation, diarrhea, nausea and vomiting.  Skin: Negative for rash.  Neurological: Negative for tremors and numbness.  Hematological: Negative for adenopathy. Does not bruise/bleed easily.  Psychiatric/Behavioral: Negative for behavioral problems and sleep disturbance. The patient is not nervous/anxious.     Today's Vitals   03/22/19 1126  BP: 116/68  Pulse: 82  Resp: 16  SpO2: 99%  Weight: 141 lb (64 kg)  Height: 5' (1.524 m)   Body mass index is 27.54 kg/m.  Physical Exam Vitals signs and nursing note reviewed.  Constitutional:      General: She is not in acute distress.    Appearance: Normal appearance. She is well-developed. She is not diaphoretic.  HENT:     Head: Normocephalic and atraumatic.     Mouth/Throat:     Pharynx: No oropharyngeal exudate.  Eyes:     Pupils: Pupils are equal, round, and reactive to light.  Neck:     Musculoskeletal: Normal range of motion and neck supple.     Thyroid: No thyromegaly.     Vascular: No JVD.     Trachea: No tracheal deviation.  Cardiovascular:     Rate and Rhythm: Normal rate and regular rhythm.     Heart sounds: Normal heart sounds. No murmur. No friction rub. No gallop.   Pulmonary:     Effort: Pulmonary effort is normal. No respiratory distress.     Breath sounds: Normal breath sounds. No wheezing or rales.  Chest:     Chest wall: No tenderness.  Abdominal:     Tenderness: There is no abdominal tenderness. There is no guarding.  Musculoskeletal: Normal range of motion.  Lymphadenopathy:     Cervical: No cervical adenopathy.  Skin:    General: Skin is warm and dry.  Neurological:     Mental Status: She is alert and oriented to person, place, and time.     Cranial Nerves: No cranial  nerve deficit.  Psychiatric:        Behavior: Behavior normal.        Thought Content: Thought content normal.        Judgment: Judgment normal.    Assessment/Plan: 1. BMI 27.0-27.9,adult May continue phentermine 37.5mg  tablets daily. Limit calorie intake to 1200 calories per day. Continue with  regular exercise in routine.  - phentermine (ADIPEX-P) 37.5 MG tablet; Take 1 tablet (37.5 mg total) by mouth daily before breakfast.  Dispense: 30 tablet; Refill: 1  General Counseling: Kimie verbalizes understanding of the findings of todays visit and agrees with plan of treatment. I have discussed any further diagnostic evaluation that may be needed or ordered today. We also reviewed her medications today. she has been encouraged to call the office with any questions or concerns that should arise related to todays visit.   There is a liability release in patients' chart. There has been a 10 minute discussion about the side effects including but not limited to elevated blood pressure, anxiety, lack of sleep and dry mouth. Pt understands and will like to start/continue on appetite suppressant at this time. There will be one month RX given at the time of visit with proper follow up. Nova diet plan with restricted calories is given to the pt. Pt understands and agrees with  plan of treatment  This patient was seen by Vincent Gros FNP Collaboration with Dr Lyndon Code as a part of collaborative care agreement  Meds ordered this encounter  Medications  . phentermine (ADIPEX-P) 37.5 MG tablet    Sig: Take 1 tablet (37.5 mg total) by mouth daily before breakfast.    Dispense:  30 tablet    Refill:  1    Order Specific Question:   Supervising Provider    Answer:   Lyndon Code [[id number]]    Time spent: 55 Minutes      Dr Lyndon Code Internal medicine

## 2019-04-02 ENCOUNTER — Encounter: Payer: Self-pay | Admitting: Nurse Practitioner

## 2019-05-15 DIAGNOSIS — G894 Chronic pain syndrome: Secondary | ICD-10-CM | POA: Diagnosis not present

## 2019-05-15 DIAGNOSIS — E7800 Pure hypercholesterolemia, unspecified: Secondary | ICD-10-CM | POA: Diagnosis not present

## 2019-05-15 DIAGNOSIS — K5904 Chronic idiopathic constipation: Secondary | ICD-10-CM | POA: Diagnosis not present

## 2019-05-15 DIAGNOSIS — G44209 Tension-type headache, unspecified, not intractable: Secondary | ICD-10-CM | POA: Diagnosis not present

## 2019-05-23 DIAGNOSIS — F329 Major depressive disorder, single episode, unspecified: Secondary | ICD-10-CM | POA: Diagnosis not present

## 2019-05-29 DIAGNOSIS — F329 Major depressive disorder, single episode, unspecified: Secondary | ICD-10-CM | POA: Diagnosis not present

## 2019-06-05 DIAGNOSIS — F329 Major depressive disorder, single episode, unspecified: Secondary | ICD-10-CM | POA: Diagnosis not present

## 2019-06-07 ENCOUNTER — Other Ambulatory Visit: Payer: Self-pay

## 2019-06-07 ENCOUNTER — Encounter: Payer: Self-pay | Admitting: Nurse Practitioner

## 2019-06-07 ENCOUNTER — Ambulatory Visit: Payer: BC Managed Care – PPO | Admitting: Nurse Practitioner

## 2019-06-07 DIAGNOSIS — Z6828 Body mass index (BMI) 28.0-28.9, adult: Secondary | ICD-10-CM

## 2019-06-07 MED ORDER — PHENTERMINE HCL 37.5 MG PO TABS: 37.5000 mg | ORAL_TABLET | Freq: Every day | ORAL | 1 refills | Status: DC

## 2019-06-07 NOTE — Progress Notes (Signed)
[facility] [address] [place], [place] [address]  Internal MEDICINE  Office Visit Note  Patient Name: Alicia Bowman  [id number]  [id number]  Date of Service: 06/11/2019  Chief Complaint  Patient presents with  . Medical Management of Chronic Issues    Weight management    The patient is here for routine follow up of weight management. Has been taking phentermine intermittently. She has had three pound weight gain, however, is spending much of her time inside as gyms and and other places remain closed due to COVID 19. She has no negative side effects associated with taking phentermine and she has done well with this so far. Blood pressure is well controlled. She has no new concerns or complaints today.       Current Medication: Outpatient Encounter Medications as of 06/07/2019  Medication Sig  . HYDROcodone-acetaminophen (NORCO) 7.5-325 MG per tablet Take 1 tablet by mouth every 6 (six) hours as needed for moderate pain.  . Linaclotide (LINZESS) 145 MCG CAPS capsule Take 1 capsule (145 mcg total) by mouth daily.  Marland Kitchen. LORazepam (ATIVAN) 0.5 MG tablet Take 0.5 mg by mouth every 8 (eight) hours.  . methocarbamol (ROBAXIN) 500 MG tablet Take 500 mg by mouth 4 (four) times daily.  . phentermine (ADIPEX-P) 37.5 MG tablet Take 1 tablet (37.5 mg total) by mouth daily before breakfast.  . [DISCONTINUED] phentermine (ADIPEX-P) 37.5 MG tablet Take 1 tablet (37.5 mg total) by mouth daily before breakfast.   No facility-administered encounter medications on file as of 06/07/2019.     Surgical History: Past Surgical History:  Procedure Laterality Date  . AUGMENTATION MAMMAPLASTY     saline  . BREAST SURGERY    . DILATION AND CURETTAGE OF UTERUS    . ENDOMETRIAL ABLATION    . FOOT SURGERY Bilateral   . HAND SURGERY Right   . TUBAL LIGATION      Medical History: Past Medical History:  Diagnosis Date  . Osteoarthritis     Family History: Family History  Problem  Relation Age of Onset  . Cancer Mother        Unknown origin  . Cancer Father        Lung    Social History   Socioeconomic History  . Marital status: Married    Spouse name: Not on file  . Number of children: Not on file  . Years of education: Not on file  . Highest education level: Not on file  Occupational History  . Not on file  Social Needs  . Financial resource strain: Not on file  . Food insecurity    Worry: Not on file    Inability: Not on file  . Transportation needs    Medical: Not on file    Non-medical: Not on file  Tobacco Use  . Smoking status: Former Smoker    Quit date: 02/18/2009    Years since quitting: 10.3  . Smokeless tobacco: Never Used  Substance and Sexual Activity  . Alcohol use: Yes    Alcohol/week: 0.0 standard drinks    Comment: RARE  . Drug use: No  . Sexual activity: Yes    Birth control/protection: Surgical    Comment: 1st intercourse- 16, partners- 5--BTL  Lifestyle  . Physical activity    Days per week: Not on file    Minutes per session: Not on file  . Stress: Not on file  Relationships  . Social connections    Talks on phone: Not on  file    Gets together: Not on file    Attends religious service: Not on file    Active member of club or organization: Not on file    Attends meetings of clubs or organizations: Not on file    Relationship status: Not on file  . Intimate partner violence    Fear of current or ex partner: Not on file    Emotionally abused: Not on file    Physically abused: Not on file    Forced sexual activity: Not on file  Other Topics Concern  . Not on file  Social History Narrative  . Not on file      Review of Systems  Constitutional: Negative for chills, fatigue and unexpected weight change.       Three pound weight gain since last visit.   HENT: Negative for congestion, rhinorrhea, sneezing and sore throat.   Respiratory: Negative for cough, chest tightness and shortness of breath.    Cardiovascular: Negative for chest pain and palpitations.  Gastrointestinal: Negative for abdominal pain, constipation, diarrhea, nausea and vomiting.  Skin: Negative for rash.  Neurological: Negative for tremors and numbness.  Hematological: Negative for adenopathy. Does not bruise/bleed easily.  Psychiatric/Behavioral: Negative for behavioral problems and sleep disturbance. The patient is not nervous/anxious.     Today's Vitals   06/07/19 1131  BP: 100/70  Pulse: 83  Resp: 16  SpO2: 100%  Weight: 144 lb (65.3 kg)  Height: 5' (1.524 m)   Body mass index is 28.12 kg/m.  Physical Exam Vitals signs and nursing note reviewed.  Constitutional:      General: She is not in acute distress.    Appearance: Normal appearance. She is well-developed. She is not diaphoretic.  HENT:     Head: Normocephalic and atraumatic.     Mouth/Throat:     Pharynx: No oropharyngeal exudate.  Eyes:     Pupils: Pupils are equal, round, and reactive to light.  Neck:     Musculoskeletal: Normal range of motion and neck supple.     Thyroid: No thyromegaly.     Vascular: No JVD.     Trachea: No tracheal deviation.  Cardiovascular:     Rate and Rhythm: Normal rate and regular rhythm.     Heart sounds: Normal heart sounds. No murmur. No friction rub. No gallop.   Pulmonary:     Effort: Pulmonary effort is normal. No respiratory distress.     Breath sounds: Normal breath sounds. No wheezing or rales.  Chest:     Chest wall: No tenderness.  Abdominal:     Tenderness: There is no abdominal tenderness. There is no guarding.  Musculoskeletal: Normal range of motion.  Lymphadenopathy:     Cervical: No cervical adenopathy.  Skin:    General: Skin is warm and dry.  Neurological:     Mental Status: She is alert and oriented to person, place, and time.     Cranial Nerves: No cranial nerve deficit.  Psychiatric:        Behavior: Behavior normal.        Thought Content: Thought content normal.         Judgment: Judgment normal.    Assessment/Plan: 1. BMI 28.0-28.9,adult May continue phentermine 37.5mg  daily to help with weight management. Limit calorie intake to 1200 calories per day and incorporate exercise into daily routine.  - phentermine (ADIPEX-P) 37.5 MG tablet; Take 1 tablet (37.5 mg total) by mouth daily before breakfast.  Dispense: 30 tablet; Refill: 1  General Counseling: Alicia Bowman verbalizes understanding of the findings of todays visit and agrees with plan of treatment. I have discussed any further diagnostic evaluation that may be needed or ordered today. We also reviewed her medications today. she has been encouraged to call the office with any questions or concerns that should arise related to todays visit.   There is a liability release in patients' chart. There has been a 10 minute discussion about the side effects including but not limited to elevated blood pressure, anxiety, lack of sleep and dry mouth. Pt understands and will like to start/continue on appetite suppressant at this time. There will be one month RX given at the time of visit with proper follow up. Nova diet plan with restricted calories is given to the pt. Pt understands and agrees with  plan of treatment  This patient was seen by Vincent GrosHeather Dali Kraner FNP Collaboration with Dr Lyndon CodeFozia M Khan as a part of collaborative care agreement  Meds ordered this encounter  Medications  . phentermine (ADIPEX-P) 37.5 MG tablet    Sig: Take 1 tablet (37.5 mg total) by mouth daily before breakfast.    Dispense:  30 tablet    Refill:  1    Order Specific Question:   Supervising Provider    Answer:   Lyndon CodeKHAN, FOZIA M [[id number]]    Time spent: 5615 Minutes      Dr Lyndon CodeFozia M Khan Internal medicine

## 2019-06-11 ENCOUNTER — Other Ambulatory Visit: Payer: Self-pay | Admitting: Nurse Practitioner

## 2019-06-11 DIAGNOSIS — Z6828 Body mass index (BMI) 28.0-28.9, adult: Secondary | ICD-10-CM

## 2019-06-13 DIAGNOSIS — F329 Major depressive disorder, single episode, unspecified: Secondary | ICD-10-CM | POA: Diagnosis not present

## 2019-06-19 DIAGNOSIS — F329 Major depressive disorder, single episode, unspecified: Secondary | ICD-10-CM | POA: Diagnosis not present

## 2019-06-26 DIAGNOSIS — F329 Major depressive disorder, single episode, unspecified: Secondary | ICD-10-CM | POA: Diagnosis not present

## 2019-07-03 DIAGNOSIS — F329 Major depressive disorder, single episode, unspecified: Secondary | ICD-10-CM | POA: Diagnosis not present

## 2019-07-11 DIAGNOSIS — F329 Major depressive disorder, single episode, unspecified: Secondary | ICD-10-CM | POA: Diagnosis not present

## 2019-07-12 ENCOUNTER — Encounter: Payer: Self-pay | Admitting: Adult Health

## 2019-07-12 ENCOUNTER — Other Ambulatory Visit: Payer: Self-pay

## 2019-07-12 ENCOUNTER — Ambulatory Visit: Payer: BC Managed Care – PPO | Admitting: Adult Health

## 2019-07-12 VITALS — BP 120/80 | HR 80 | Resp 16 | Ht 60.0 in | Wt 143.4 lb

## 2019-07-12 DIAGNOSIS — F411 Generalized anxiety disorder: Secondary | ICD-10-CM

## 2019-07-12 DIAGNOSIS — Z6828 Body mass index (BMI) 28.0-28.9, adult: Secondary | ICD-10-CM

## 2019-07-12 MED ORDER — PHENTERMINE HCL 37.5 MG PO TABS: 37.5000 mg | ORAL_TABLET | Freq: Every day | ORAL | 0 refills | Status: DC

## 2019-07-12 NOTE — Progress Notes (Signed)
[facility] [address] [place], [place] [address]  Internal MEDICINE  Office Visit Note  Patient Name: Alicia Bowman  [id number]  [id number]  Date of Service: 07/12/2019  Chief Complaint  Patient presents with  . Follow-up    weight management    HPI  Pt is here for follow up on weight management.  Patient is using phentermine for weight loss.  Since our last visit they have lost 1 pounds.  The patient denies any chest pain, palpitations, shortness of breath, constipation, headaches or any other side effects of the medication.  Patient wishes to continue to use this medication for weight loss at this time.   Current Medication: Outpatient Encounter Medications as of 07/12/2019  Medication Sig  . HYDROcodone-acetaminophen (NORCO) 7.5-325 MG per tablet Take 1 tablet by mouth every 6 (six) hours as needed for moderate pain.  . Linaclotide (LINZESS) 145 MCG CAPS capsule Take 1 capsule (145 mcg total) by mouth daily.  Marland Kitchen. LORazepam (ATIVAN) 0.5 MG tablet Take 0.5 mg by mouth every 8 (eight) hours.  . methocarbamol (ROBAXIN) 500 MG tablet Take 500 mg by mouth 4 (four) times daily.  . phentermine (ADIPEX-P) 37.5 MG tablet Take 1 tablet (37.5 mg total) by mouth daily before breakfast.  . [DISCONTINUED] phentermine (ADIPEX-P) 37.5 MG tablet Take 1 tablet (37.5 mg total) by mouth daily before breakfast.   No facility-administered encounter medications on file as of 07/12/2019.     Surgical History: Past Surgical History:  Procedure Laterality Date  . AUGMENTATION MAMMAPLASTY     saline  . BREAST SURGERY    . DILATION AND CURETTAGE OF UTERUS    . ENDOMETRIAL ABLATION    . FOOT SURGERY Bilateral   . HAND SURGERY Right   . TUBAL LIGATION      Medical History: Past Medical History:  Diagnosis Date  . Osteoarthritis     Family History: Family History  Problem Relation Age of Onset  . Cancer Mother        Unknown origin  . Cancer Father        Lung    Social  History   Socioeconomic History  . Marital status: Married    Spouse name: Not on file  . Number of children: Not on file  . Years of education: Not on file  . Highest education level: Not on file  Occupational History  . Not on file  Social Needs  . Financial resource strain: Not on file  . Food insecurity    Worry: Not on file    Inability: Not on file  . Transportation needs    Medical: Not on file    Non-medical: Not on file  Tobacco Use  . Smoking status: Former Smoker    Quit date: 02/18/2009    Years since quitting: 10.4  . Smokeless tobacco: Never Used  Substance and Sexual Activity  . Alcohol use: Yes    Alcohol/week: 0.0 standard drinks    Comment: RARE  . Drug use: No  . Sexual activity: Yes    Birth control/protection: Surgical    Comment: 1st intercourse- 16, partners- 5--BTL  Lifestyle  . Physical activity    Days per week: Not on file    Minutes per session: Not on file  . Stress: Not on file  Relationships  . Social Musicianconnections    Talks on phone: Not on file    Gets together: Not on file    Attends religious service: Not on file  Active member of club or organization: Not on file    Attends meetings of clubs or organizations: Not on file    Relationship status: Not on file  . Intimate partner violence    Fear of current or ex partner: Not on file    Emotionally abused: Not on file    Physically abused: Not on file    Forced sexual activity: Not on file  Other Topics Concern  . Not on file  Social History Narrative  . Not on file      Review of Systems  Constitutional: Negative for chills, fatigue and unexpected weight change.  HENT: Negative for congestion, rhinorrhea, sneezing and sore throat.   Eyes: Negative for photophobia, pain and redness.  Respiratory: Negative for cough, chest tightness and shortness of breath.   Cardiovascular: Negative for chest pain and palpitations.  Gastrointestinal: Negative for abdominal pain,  constipation, diarrhea, nausea and vomiting.  Endocrine: Negative.   Genitourinary: Negative for dysuria and frequency.  Musculoskeletal: Negative for arthralgias, back pain, joint swelling and neck pain.  Skin: Negative for rash.  Allergic/Immunologic: Negative.   Neurological: Negative for tremors and numbness.  Hematological: Negative for adenopathy. Does not bruise/bleed easily.  Psychiatric/Behavioral: Negative for behavioral problems and sleep disturbance. The patient is not nervous/anxious.     Vital Signs: BP 120/80   Pulse 80   Resp 16   Ht 5' (1.524 m)   Wt 143 lb 6.4 oz (65 kg)   SpO2 100%   BMI 28.01 kg/m    Physical Exam Vitals signs and nursing note reviewed.  Constitutional:      General: She is not in acute distress.    Appearance: She is well-developed. She is not diaphoretic.  HENT:     Head: Normocephalic and atraumatic.     Mouth/Throat:     Pharynx: No oropharyngeal exudate.  Eyes:     Pupils: Pupils are equal, round, and reactive to light.  Neck:     Musculoskeletal: Normal range of motion and neck supple.     Thyroid: No thyromegaly.     Vascular: No JVD.     Trachea: No tracheal deviation.  Cardiovascular:     Rate and Rhythm: Normal rate and regular rhythm.     Heart sounds: Normal heart sounds. No murmur. No friction rub. No gallop.   Pulmonary:     Effort: Pulmonary effort is normal. No respiratory distress.     Breath sounds: Normal breath sounds. No wheezing or rales.  Chest:     Chest wall: No tenderness.  Abdominal:     Palpations: Abdomen is soft.     Tenderness: There is no abdominal tenderness. There is no guarding.  Musculoskeletal: Normal range of motion.  Lymphadenopathy:     Cervical: No cervical adenopathy.  Skin:    General: Skin is warm and dry.  Neurological:     Mental Status: She is alert and oriented to person, place, and time.     Cranial Nerves: No cranial nerve deficit.  Psychiatric:        Behavior: Behavior  normal.        Thought Content: Thought content normal.        Judgment: Judgment normal.    Assessment/Plan: 1. BMI 28.0-28.9,adult There is a liability release in patients' chart. There has been a 10 minute discussion about the side effects including but not limited to elevated blood pressure, anxiety, lack of sleep and dry mouth. Pt understands and will like to start/continue  on appetite suppressant at this time. There will be one month RX given at the time of visit with proper follow up. Nova diet plan with restricted calories is given to the pt. Pt understands and agrees with  plan of treatment - phentermine (ADIPEX-P) 37.5 MG tablet; Take 1 tablet (37.5 mg total) by mouth daily before breakfast.  Dispense: 30 tablet; Refill: 0 Obesity Counseling: Risk Assessment: An assessment of behavioral risk factors was made today and includes lack of exercise sedentary lifestyle, lack of portion control and poor dietary habits.  Risk Modification Advice: She was counseled on portion control guidelines. Restricting daily caloric intake to. . The detrimental long term effects of obesity on her health and ongoing poor compliance was also discussed with the patient.   2. Generalized anxiety disorder Stable, pt will continue her current therapy through PCP.   General Counseling: Alicia Bowman understanding of the findings of todays visit and agrees with plan of treatment. I have discussed any further diagnostic evaluation that may be needed or ordered today. We also reviewed her medications today. she has been encouraged to call the office with any questions or concerns that should arise related to todays visit.    No orders of the defined types were placed in this encounter.   Meds ordered this encounter  Medications  . phentermine (ADIPEX-P) 37.5 MG tablet    Sig: Take 1 tablet (37.5 mg total) by mouth daily before breakfast.    Dispense:  30 tablet    Refill:  0    Time spent: 15   Minutes   This patient was seen by Orson Gear AGNP-C in Collaboration with Dr Lavera Guise as a part of collaborative care agreement     Alicia Bowman AGNP-C Internal medicine

## 2019-07-17 DIAGNOSIS — F329 Major depressive disorder, single episode, unspecified: Secondary | ICD-10-CM | POA: Diagnosis not present

## 2019-07-25 DIAGNOSIS — F329 Major depressive disorder, single episode, unspecified: Secondary | ICD-10-CM | POA: Diagnosis not present

## 2019-07-31 DIAGNOSIS — F329 Major depressive disorder, single episode, unspecified: Secondary | ICD-10-CM | POA: Diagnosis not present

## 2019-08-08 DIAGNOSIS — F329 Major depressive disorder, single episode, unspecified: Secondary | ICD-10-CM | POA: Diagnosis not present

## 2019-08-15 DIAGNOSIS — F329 Major depressive disorder, single episode, unspecified: Secondary | ICD-10-CM | POA: Diagnosis not present

## 2019-08-22 DIAGNOSIS — F329 Major depressive disorder, single episode, unspecified: Secondary | ICD-10-CM | POA: Diagnosis not present

## 2019-08-23 ENCOUNTER — Ambulatory Visit: Payer: BC Managed Care – PPO | Admitting: Adult Health

## 2019-08-23 ENCOUNTER — Encounter: Payer: Self-pay | Admitting: Gynecology

## 2019-08-23 ENCOUNTER — Other Ambulatory Visit: Payer: Self-pay

## 2019-08-23 ENCOUNTER — Encounter: Payer: Self-pay | Admitting: Adult Health

## 2019-08-23 VITALS — BP 104/62 | HR 98 | Resp 16 | Ht 60.0 in | Wt 142.0 lb

## 2019-08-23 DIAGNOSIS — F411 Generalized anxiety disorder: Secondary | ICD-10-CM | POA: Diagnosis not present

## 2019-08-23 DIAGNOSIS — Z6828 Body mass index (BMI) 28.0-28.9, adult: Secondary | ICD-10-CM | POA: Diagnosis not present

## 2019-08-23 MED ORDER — PHENTERMINE HCL 37.5 MG PO TABS: 37.5000 mg | ORAL_TABLET | Freq: Every day | ORAL | 0 refills | Status: DC

## 2019-08-23 NOTE — Progress Notes (Signed)
[facility] [address] [place], [place] [address]  Internal MEDICINE  Office Visit Note  Patient Name: Alicia Bowman  [id number]  [id number]  Date of Service: 08/23/2019  Chief Complaint  Patient presents with  . Medical Management of Chronic Issues    weight loss management     HPI  Pt is here for follow up on weight loss. Patient is using phentermine for weight loss.  Since our last visit they have lost 1 pounds.  The patient denies any chest pain, palpitations, shortness of breath, constipation, headaches or any other side effects of the medication.  Patient wishes to continue to use this medication for weight loss at this time.   Current Medication: Outpatient Encounter Medications as of 08/23/2019  Medication Sig  . HYDROcodone-acetaminophen (NORCO) 7.5-325 MG per tablet Take 1 tablet by mouth every 6 (six) hours as needed for moderate pain.  . Linaclotide (LINZESS) 145 MCG CAPS capsule Take 1 capsule (145 mcg total) by mouth daily.  Marland Kitchen LORazepam (ATIVAN) 0.5 MG tablet Take 0.5 mg by mouth every 8 (eight) hours.  . methocarbamol (ROBAXIN) 500 MG tablet Take 500 mg by mouth 4 (four) times daily.  . phentermine (ADIPEX-P) 37.5 MG tablet Take 1 tablet (37.5 mg total) by mouth daily before breakfast.  . [DISCONTINUED] phentermine (ADIPEX-P) 37.5 MG tablet Take 1 tablet (37.5 mg total) by mouth daily before breakfast.   No facility-administered encounter medications on file as of 08/23/2019.     Surgical History: Past Surgical History:  Procedure Laterality Date  . AUGMENTATION MAMMAPLASTY     saline  . BREAST SURGERY    . DILATION AND CURETTAGE OF UTERUS    . ENDOMETRIAL ABLATION    . FOOT SURGERY Bilateral   . HAND SURGERY Right   . TUBAL LIGATION      Medical History: Past Medical History:  Diagnosis Date  . Osteoarthritis     Family History: Family History  Problem Relation Age of Onset  . Cancer Mother        Unknown origin  . Cancer Father         Lung    Social History   Socioeconomic History  . Marital status: Married    Spouse name: Not on file  . Number of children: Not on file  . Years of education: Not on file  . Highest education level: Not on file  Occupational History  . Not on file  Social Needs  . Financial resource strain: Not on file  . Food insecurity    Worry: Not on file    Inability: Not on file  . Transportation needs    Medical: Not on file    Non-medical: Not on file  Tobacco Use  . Smoking status: Former Smoker    Quit date: 02/18/2009    Years since quitting: 10.5  . Smokeless tobacco: Never Used  Substance and Sexual Activity  . Alcohol use: Yes    Alcohol/week: 0.0 standard drinks    Comment: RARE  . Drug use: No  . Sexual activity: Yes    Birth control/protection: Surgical    Comment: 1st intercourse- 16, partners- 5--BTL  Lifestyle  . Physical activity    Days per week: Not on file    Minutes per session: Not on file  . Stress: Not on file  Relationships  . Social Musician on phone: Not on file    Gets together: Not on file    Attends  religious service: Not on file    Active member of club or organization: Not on file    Attends meetings of clubs or organizations: Not on file    Relationship status: Not on file  . Intimate partner violence    Fear of current or ex partner: Not on file    Emotionally abused: Not on file    Physically abused: Not on file    Forced sexual activity: Not on file  Other Topics Concern  . Not on file  Social History Narrative  . Not on file      Review of Systems  Constitutional: Negative for chills, fatigue and unexpected weight change.  HENT: Negative for congestion, rhinorrhea, sneezing and sore throat.   Eyes: Negative for photophobia, pain and redness.  Respiratory: Negative for cough, chest tightness and shortness of breath.   Cardiovascular: Negative for chest pain and palpitations.  Gastrointestinal: Negative for  abdominal pain, constipation, diarrhea, nausea and vomiting.  Endocrine: Negative.   Genitourinary: Negative for dysuria and frequency.  Musculoskeletal: Negative for arthralgias, back pain, joint swelling and neck pain.  Skin: Negative for rash.  Allergic/Immunologic: Negative.   Neurological: Negative for tremors and numbness.  Hematological: Negative for adenopathy. Does not bruise/bleed easily.  Psychiatric/Behavioral: Negative for behavioral problems and sleep disturbance. The patient is not nervous/anxious.     Vital Signs: BP 104/62   Pulse 98   Resp 16   Ht 5' (1.524 m)   Wt 142 lb (64.4 kg)   SpO2 100%   BMI 27.73 kg/m    Physical Exam Vitals signs and nursing note reviewed.  Constitutional:      General: She is not in acute distress.    Appearance: She is well-developed. She is not diaphoretic.  HENT:     Head: Normocephalic and atraumatic.     Mouth/Throat:     Pharynx: No oropharyngeal exudate.  Eyes:     Pupils: Pupils are equal, round, and reactive to light.  Neck:     Musculoskeletal: Normal range of motion and neck supple.     Thyroid: No thyromegaly.     Vascular: No JVD.     Trachea: No tracheal deviation.  Cardiovascular:     Rate and Rhythm: Normal rate and regular rhythm.     Heart sounds: Normal heart sounds. No murmur. No friction rub. No gallop.   Pulmonary:     Effort: Pulmonary effort is normal. No respiratory distress.     Breath sounds: Normal breath sounds. No wheezing or rales.  Chest:     Chest wall: No tenderness.  Abdominal:     Palpations: Abdomen is soft.     Tenderness: There is no abdominal tenderness. There is no guarding.  Musculoskeletal: Normal range of motion.  Lymphadenopathy:     Cervical: No cervical adenopathy.  Skin:    General: Skin is warm and dry.  Neurological:     Mental Status: She is alert and oriented to person, place, and time.     Cranial Nerves: No cranial nerve deficit.  Psychiatric:        Behavior:  Behavior normal.        Thought Content: Thought content normal.        Judgment: Judgment normal.    Assessment/Plan: 1. BMI 28.0-28.9,adult Obesity Counseling: Risk Assessment: An assessment of behavioral risk factors was made today and includes lack of exercise sedentary lifestyle, lack of portion control and poor dietary habits.  Risk Modification Advice: She was counseled  on portion control guidelines. Restricting daily caloric intake to. . The detrimental long term effects of obesity on her health and ongoing poor compliance was also discussed with the patient.  There is a liability release in patients' chart. There has been a 10 minute discussion about the side effects including but not limited to elevated blood pressure, anxiety, lack of sleep and dry mouth. Pt understands and will like to start/continue on appetite suppressant at this time. There will be one month RX given at the time of visit with proper follow up. Nova diet plan with restricted calories is given to the pt. Pt understands and agrees with  plan of treatment   2. Generalized anxiety disorder Stable, continue present management.   General Counseling: Vonna Kotyk understanding of the findings of todays visit and agrees with plan of treatment. I have discussed any further diagnostic evaluation that may be needed or ordered today. We also reviewed her medications today. she has been encouraged to call the office with any questions or concerns that should arise related to todays visit.    No orders of the defined types were placed in this encounter.   Meds ordered this encounter  Medications  . phentermine (ADIPEX-P) 37.5 MG tablet    Sig: Take 1 tablet (37.5 mg total) by mouth daily before breakfast.    Dispense:  30 tablet    Refill:  0    Time spent: 15 Minutes   This patient was seen by Orson Gear AGNP-C in Collaboration with Dr Lavera Guise as a part of collaborative care agreement     Kendell Bane AGNP-C Internal medicine

## 2019-08-26 DIAGNOSIS — N3090 Cystitis, unspecified without hematuria: Secondary | ICD-10-CM | POA: Diagnosis not present

## 2019-08-26 DIAGNOSIS — N3001 Acute cystitis with hematuria: Secondary | ICD-10-CM | POA: Diagnosis not present

## 2019-08-28 DIAGNOSIS — F329 Major depressive disorder, single episode, unspecified: Secondary | ICD-10-CM | POA: Diagnosis not present

## 2019-09-04 DIAGNOSIS — F329 Major depressive disorder, single episode, unspecified: Secondary | ICD-10-CM | POA: Diagnosis not present

## 2019-09-11 DIAGNOSIS — F329 Major depressive disorder, single episode, unspecified: Secondary | ICD-10-CM | POA: Diagnosis not present

## 2019-09-18 DIAGNOSIS — F329 Major depressive disorder, single episode, unspecified: Secondary | ICD-10-CM | POA: Diagnosis not present

## 2019-09-25 DIAGNOSIS — F329 Major depressive disorder, single episode, unspecified: Secondary | ICD-10-CM | POA: Diagnosis not present

## 2019-09-27 ENCOUNTER — Other Ambulatory Visit: Payer: Self-pay

## 2019-09-27 ENCOUNTER — Ambulatory Visit: Payer: BC Managed Care – PPO | Admitting: Internal Medicine

## 2019-09-27 ENCOUNTER — Encounter: Payer: Self-pay | Admitting: Adult Health

## 2019-09-27 DIAGNOSIS — Z6828 Body mass index (BMI) 28.0-28.9, adult: Secondary | ICD-10-CM | POA: Diagnosis not present

## 2019-09-27 DIAGNOSIS — Z23 Encounter for immunization: Secondary | ICD-10-CM

## 2019-09-27 DIAGNOSIS — G8929 Other chronic pain: Secondary | ICD-10-CM

## 2019-09-27 MED ORDER — PHENTERMINE HCL 37.5 MG PO TABS: 37.5000 mg | ORAL_TABLET | Freq: Every day | ORAL | 0 refills | Status: DC

## 2019-09-27 NOTE — Progress Notes (Signed)
[facility] [address] [place], [place] [address]  Internal MEDICINE  Office Visit Note  Patient Name: Alicia Bowman  [id number]  [id number]  Date of Service: 09/30/2019  Chief Complaint  Patient presents with  . Follow-up    weight management     HPI  Pt is here for weight loss management, she is at the end of her treatment, will be tapered to off in couple of months, has done well with weight loss. Her baseline weight was 174 last year. Today she is 141 lbs. She started walking again, had to stop due to Covid. She is limited to what can be done with activity due to back pain, She does have a pcp. Occasional constipation   Current Medication: Outpatient Encounter Medications as of 09/27/2019  Medication Sig  . HYDROcodone-acetaminophen (NORCO) 7.5-325 MG per tablet Take 1 tablet by mouth every 6 (six) hours as needed for moderate pain.  . Linaclotide (LINZESS) 145 MCG CAPS capsule Take 1 capsule (145 mcg total) by mouth daily.  Marland Kitchen LORazepam (ATIVAN) 0.5 MG tablet Take 0.5 mg by mouth every 8 (eight) hours.  . methocarbamol (ROBAXIN) 500 MG tablet Take 500 mg by mouth 4 (four) times daily.  . phentermine (ADIPEX-P) 37.5 MG tablet Take 1 tablet (37.5 mg total) by mouth daily before breakfast.  . [DISCONTINUED] phentermine (ADIPEX-P) 37.5 MG tablet Take 1 tablet (37.5 mg total) by mouth daily before breakfast.   No facility-administered encounter medications on file as of 09/27/2019.     Surgical History: Past Surgical History:  Procedure Laterality Date  . AUGMENTATION MAMMAPLASTY     saline  . BREAST SURGERY    . DILATION AND CURETTAGE OF UTERUS    . ENDOMETRIAL ABLATION    . FOOT SURGERY Bilateral   . HAND SURGERY Right   . TUBAL LIGATION      Medical History: Past Medical History:  Diagnosis Date  . Osteoarthritis     Family History: Family History  Problem Relation Age of Onset  . Cancer Mother        Unknown origin  . Cancer Father    Lung    Social History   Socioeconomic History  . Marital status: Married    Spouse name: Not on file  . Number of children: Not on file  . Years of education: Not on file  . Highest education level: Not on file  Occupational History  . Not on file  Social Needs  . Financial resource strain: Not on file  . Food insecurity    Worry: Not on file    Inability: Not on file  . Transportation needs    Medical: Not on file    Non-medical: Not on file  Tobacco Use  . Smoking status: Former Smoker    Quit date: 02/18/2009    Years since quitting: 10.6  . Smokeless tobacco: Never Used  Substance and Sexual Activity  . Alcohol use: Yes    Alcohol/week: 0.0 standard drinks    Comment: RARE  . Drug use: No  . Sexual activity: Yes    Birth control/protection: Surgical    Comment: 1st intercourse- 16, partners- 5--BTL  Lifestyle  . Physical activity    Days per week: Not on file    Minutes per session: Not on file  . Stress: Not on file  Relationships  . Social Musician on phone: Not on file    Gets together: Not on file  Attends religious service: Not on file    Active member of club or organization: Not on file    Attends meetings of clubs or organizations: Not on file    Relationship status: Not on file  . Intimate partner violence    Fear of current or ex partner: Not on file    Emotionally abused: Not on file    Physically abused: Not on file    Forced sexual activity: Not on file  Other Topics Concern  . Not on file  Social History Narrative  . Not on file    Review of Systems  Constitutional: Negative for chills, diaphoresis and fatigue.  HENT: Negative for ear pain, postnasal drip and sinus pressure.   Eyes: Negative for photophobia, discharge, redness, itching and visual disturbance.  Respiratory: Negative for cough, shortness of breath and wheezing.   Cardiovascular: Negative for chest pain, palpitations and leg swelling.  Gastrointestinal:  Positive for constipation. Negative for abdominal pain, diarrhea, nausea and vomiting.  Genitourinary: Negative for dysuria and flank pain.  Musculoskeletal: Positive for back pain. Negative for arthralgias, gait problem and neck pain.  Skin: Negative for color change.  Allergic/Immunologic: Negative for environmental allergies and food allergies.  Neurological: Negative for dizziness and headaches.  Hematological: Does not bruise/bleed easily.  Psychiatric/Behavioral: Negative for agitation, behavioral problems (depression) and hallucinations.    Vital Signs: BP 130/86   Pulse 82   Resp 16   Ht 5' (1.524 m)   Wt 141 lb 3.2 oz (64 kg)   SpO2 98%   BMI 27.58 kg/m    Physical Exam Constitutional:      Appearance: Normal appearance.  Cardiovascular:     Rate and Rhythm: Normal rate and regular rhythm.  Pulmonary:     Effort: Pulmonary effort is normal.     Breath sounds: Normal breath sounds.  Neurological:     Mental Status: She is alert.    Assessment/Plan: 1. Other chronic pain - Continue to see her MD  2. Flu vaccine need - Flu Vaccine MDCK QUAD PF  3. BMI 28.0-28.9,adult - Will start tapering off, follow guidelines for app suppressant therapy  - phentermine (ADIPEX-P) 37.5 MG tablet; Take 1 tablet (37.5 mg total) by mouth daily before breakfast.  Dispense: 30 tablet; Refill: 0  General Counseling: Claiborne Billings verbalizes understanding of the findings of todays visit and agrees with plan of treatment. I have discussed any further diagnostic evaluation that may be needed or ordered today. We also reviewed her medications today. she has been encouraged to call the office with any questions or concerns that should arise related to todays visit.   Orders Placed This Encounter  Procedures  . Flu Vaccine MDCK QUAD PF    Meds ordered this encounter  Medications  . phentermine (ADIPEX-P) 37.5 MG tablet    Sig: Take 1 tablet (37.5 mg total) by mouth daily before breakfast.     Dispense:  30 tablet    Refill:  0    Time spent:15 Minutes      Dr Lavera Guise Internal medicine

## 2019-10-02 DIAGNOSIS — F329 Major depressive disorder, single episode, unspecified: Secondary | ICD-10-CM | POA: Diagnosis not present

## 2019-10-09 DIAGNOSIS — F329 Major depressive disorder, single episode, unspecified: Secondary | ICD-10-CM | POA: Diagnosis not present

## 2019-10-17 DIAGNOSIS — F329 Major depressive disorder, single episode, unspecified: Secondary | ICD-10-CM | POA: Diagnosis not present

## 2019-10-24 DIAGNOSIS — F329 Major depressive disorder, single episode, unspecified: Secondary | ICD-10-CM | POA: Diagnosis not present

## 2019-10-31 DIAGNOSIS — F329 Major depressive disorder, single episode, unspecified: Secondary | ICD-10-CM | POA: Diagnosis not present

## 2019-11-07 DIAGNOSIS — F329 Major depressive disorder, single episode, unspecified: Secondary | ICD-10-CM | POA: Diagnosis not present

## 2019-11-08 ENCOUNTER — Ambulatory Visit: Payer: BC Managed Care – PPO | Admitting: Adult Health

## 2019-11-13 DIAGNOSIS — F329 Major depressive disorder, single episode, unspecified: Secondary | ICD-10-CM | POA: Diagnosis not present

## 2019-11-14 DIAGNOSIS — K5904 Chronic idiopathic constipation: Secondary | ICD-10-CM | POA: Diagnosis not present

## 2019-11-14 DIAGNOSIS — G894 Chronic pain syndrome: Secondary | ICD-10-CM | POA: Diagnosis not present

## 2019-11-20 DIAGNOSIS — F329 Major depressive disorder, single episode, unspecified: Secondary | ICD-10-CM | POA: Diagnosis not present

## 2019-11-22 DIAGNOSIS — R300 Dysuria: Secondary | ICD-10-CM | POA: Diagnosis not present

## 2019-11-22 DIAGNOSIS — E7800 Pure hypercholesterolemia, unspecified: Secondary | ICD-10-CM | POA: Diagnosis not present

## 2019-11-27 DIAGNOSIS — F329 Major depressive disorder, single episode, unspecified: Secondary | ICD-10-CM | POA: Diagnosis not present

## 2019-12-05 DIAGNOSIS — F329 Major depressive disorder, single episode, unspecified: Secondary | ICD-10-CM | POA: Diagnosis not present

## 2019-12-14 DIAGNOSIS — F329 Major depressive disorder, single episode, unspecified: Secondary | ICD-10-CM | POA: Diagnosis not present

## 2019-12-19 DIAGNOSIS — F329 Major depressive disorder, single episode, unspecified: Secondary | ICD-10-CM | POA: Diagnosis not present

## 2019-12-21 DIAGNOSIS — L814 Other melanin hyperpigmentation: Secondary | ICD-10-CM | POA: Diagnosis not present

## 2019-12-21 DIAGNOSIS — Z23 Encounter for immunization: Secondary | ICD-10-CM | POA: Diagnosis not present

## 2019-12-21 DIAGNOSIS — Z411 Encounter for cosmetic surgery: Secondary | ICD-10-CM | POA: Diagnosis not present

## 2019-12-25 DIAGNOSIS — F329 Major depressive disorder, single episode, unspecified: Secondary | ICD-10-CM | POA: Diagnosis not present

## 2020-01-01 DIAGNOSIS — F329 Major depressive disorder, single episode, unspecified: Secondary | ICD-10-CM | POA: Diagnosis not present

## 2020-01-29 ENCOUNTER — Other Ambulatory Visit: Payer: Self-pay

## 2020-01-29 DIAGNOSIS — Z01419 Encounter for gynecological examination (general) (routine) without abnormal findings: Secondary | ICD-10-CM | POA: Diagnosis not present

## 2020-01-29 DIAGNOSIS — F329 Major depressive disorder, single episode, unspecified: Secondary | ICD-10-CM | POA: Diagnosis not present

## 2020-01-29 DIAGNOSIS — R87618 Other abnormal cytological findings on specimens from cervix uteri: Secondary | ICD-10-CM | POA: Diagnosis not present

## 2020-01-30 ENCOUNTER — Ambulatory Visit (INDEPENDENT_AMBULATORY_CARE_PROVIDER_SITE_OTHER): Payer: BC Managed Care – PPO | Admitting: Obstetrics and Gynecology

## 2020-01-30 ENCOUNTER — Encounter: Payer: Self-pay | Admitting: Obstetrics and Gynecology

## 2020-01-30 ENCOUNTER — Encounter: Payer: BLUE CROSS/BLUE SHIELD | Admitting: Gynecology

## 2020-01-30 VITALS — BP 118/76 | Ht 60.0 in | Wt 163.0 lb

## 2020-01-30 DIAGNOSIS — Z01419 Encounter for gynecological examination (general) (routine) without abnormal findings: Secondary | ICD-10-CM

## 2020-01-30 NOTE — Progress Notes (Signed)
Alicia Bowman 12/05/1965 [id number]  SUBJECTIVE:  54 y.o. G3P3 female for annual routine gynecologic exam and Pap smear. She has no gynecologic concerns. Light spotting typically each month but had a full blown period last month.  Not yet been a year without period. Mild hot flashes.   Current Outpatient Medications  Medication Sig Dispense Refill  . HYDROcodone-acetaminophen (NORCO) 7.5-325 MG per tablet Take 1 tablet by mouth every 6 (six) hours as needed for moderate pain.    . Linaclotide (LINZESS) 145 MCG CAPS capsule Take 1 capsule (145 mcg total) by mouth daily. 30 capsule 11  . LORazepam (ATIVAN) 0.5 MG tablet Take 0.5 mg by mouth every 8 (eight) hours.    . methocarbamol (ROBAXIN) 500 MG tablet Take 500 mg by mouth 4 (four) times daily.     No current facility-administered medications for this visit.   Allergies: Patient has no known allergies.  No LMP recorded. Patient has had an ablation.  Past medical history,surgical history, problem list, medications, allergies, family history and social history were all reviewed and documented as reviewed in the EPIC chart.  ROS:  Feeling well. No dyspnea or chest pain on exertion.  No abdominal pain, change in bowel habits, black or bloody stools.  No urinary tract symptoms. GYN ROS: irregular menses, no abnormal bleeding, pelvic pain or discharge, no breast pain or new or enlarging lumps on self exam. No neurological complaints.   OBJECTIVE:  BP 118/76   Ht 5' (1.524 m)   Wt 163 lb (73.9 kg)   BMI 31.83 kg/m  The patient appears well, alert, oriented x 3, in no distress. ENT normal.  Neck supple. No cervical or supraclavicular adenopathy or thyromegaly.  Lungs are clear, good air entry, no wheezes, rhonchi or rales. S1 and S2 normal, no murmurs, regular rate and rhythm.  Abdomen soft without tenderness, guarding, mass or organomegaly.  Neurological is normal, no focal findings.  BREAST EXAM: breasts appear normal, no  suspicious masses, no skin or nipple changes or axillary nodes  PELVIC EXAM: VULVA: normal appearing vulva with no masses, tenderness or lesions, VAGINA: normal appearing vagina with normal color and discharge, no lesions, CERVIX: normal appearing cervix without discharge or lesions, UTERUS: uterus is normal size, shape, consistency and nontender, ADNEXA: normal adnexa in size, nontender and no masses, PAP: Pap smear done today, thin-prep method  Chaperone: Caryn Bee present during the examination  ASSESSMENT:  54 y.o. G3P3 here for annual gynecologic exam  PLAN:   1. Perimenopause.  Prior endometrial ablation.  Still has late menses many but not every month.  She did have a more regular period last month.  She has not been 1 year amenorrheic.  Minor hot flashes but tolerating fine.  No concerns at this time. 2. Pap smear 10/2017.  No significant history of abnormal Pap smears.  Pap smear repeated today. 3. Mammogram years ago.  Normal breast exam today.  Strongly recommended that mammograms are continued as this is a very common cancer in women and there are many benefits to early detection beyond what is detectable during the clinical breast exam.  She is reminded to schedule an annual mammogram this year  4. Colonoscopy never.  Second most common cancer, important to get screening as many times asymptomatic until far advance.  Strongly recommended that she get this scheduled, or at least ask her primary doctor about potential alternatives such as the Cologuard test. 5. Health maintenance.  No labs today as she normally has  these completed with her primary care provider.   Return annually or sooner, prn.  Theresia Majors MD  01/30/20

## 2020-01-30 NOTE — Patient Instructions (Addendum)
Please remember to schedule your annual mammogram, this year. Please schedule a colonoscopy at your convenience, or ask your doctor about other possible alternatives for colon cancer screening such as Cologard

## 2020-01-30 NOTE — Addendum Note (Signed)
Addended by: Dayna Barker on: 01/30/2020 04:27 PM   Modules accepted: Orders

## 2020-01-31 LAB — PAP IG W/ RFLX HPV ASCU

## 2020-02-05 DIAGNOSIS — H1013 Acute atopic conjunctivitis, bilateral: Secondary | ICD-10-CM | POA: Diagnosis not present

## 2020-02-05 DIAGNOSIS — H0102B Squamous blepharitis left eye, upper and lower eyelids: Secondary | ICD-10-CM | POA: Diagnosis not present

## 2020-02-05 DIAGNOSIS — H0102A Squamous blepharitis right eye, upper and lower eyelids: Secondary | ICD-10-CM | POA: Diagnosis not present

## 2020-02-05 DIAGNOSIS — H16223 Keratoconjunctivitis sicca, not specified as Sjogren's, bilateral: Secondary | ICD-10-CM | POA: Diagnosis not present

## 2020-02-28 ENCOUNTER — Encounter: Payer: Self-pay | Admitting: Adult Health

## 2020-02-28 ENCOUNTER — Ambulatory Visit: Payer: Self-pay | Admitting: Adult Health

## 2020-02-28 ENCOUNTER — Other Ambulatory Visit: Payer: Self-pay

## 2020-02-28 VITALS — BP 130/79 | HR 85 | Temp 97.9°F | Resp 16 | Ht 60.0 in | Wt 162.6 lb

## 2020-02-28 DIAGNOSIS — G8929 Other chronic pain: Secondary | ICD-10-CM

## 2020-02-28 DIAGNOSIS — F411 Generalized anxiety disorder: Secondary | ICD-10-CM

## 2020-02-28 DIAGNOSIS — Z6831 Body mass index (BMI) 31.0-31.9, adult: Secondary | ICD-10-CM

## 2020-02-28 MED ORDER — PHENTERMINE HCL 37.5 MG PO CAPS: 37.5000 mg | ORAL_CAPSULE | ORAL | 0 refills | Status: DC

## 2020-02-28 NOTE — Progress Notes (Signed)
[facility] [place], [place] [address]  Internal MEDICINE  Office Visit Note  Patient Name: Alicia Bowman  [id number]  [id number]  Date of Service: 03/03/2020  Chief Complaint  Patient presents with  . Follow-up    weightloss    HPI  Pt has returned at this time to restart weight loss management.  She tapered off phentermine when she reached 141 pounds.  She has now gained 21 pounds back at this time.  She would like to restart.  She is now back in the gym, and she has changed her diet back to a more reduced calorie diet.    Current Medication: Outpatient Encounter Medications as of 02/28/2020  Medication Sig  . HYDROcodone-acetaminophen (NORCO) 7.5-325 MG per tablet Take 1 tablet by mouth every 6 (six) hours as needed for moderate pain.  . Linaclotide (LINZESS) 145 MCG CAPS capsule Take 1 capsule (145 mcg total) by mouth daily.  Marland Kitchen LORazepam (ATIVAN) 0.5 MG tablet Take 0.5 mg by mouth every 8 (eight) hours.  . methocarbamol (ROBAXIN) 500 MG tablet Take 500 mg by mouth 4 (four) times daily.  . phentermine 37.5 MG capsule Take 1 capsule (37.5 mg total) by mouth every morning.   No facility-administered encounter medications on file as of 02/28/2020.    Surgical History: Past Surgical History:  Procedure Laterality Date  . AUGMENTATION MAMMAPLASTY     saline  . BREAST SURGERY    . DILATION AND CURETTAGE OF UTERUS    . ENDOMETRIAL ABLATION    . FOOT SURGERY Bilateral   . HAND SURGERY Right   . TUBAL LIGATION      Medical History: Past Medical History:  Diagnosis Date  . Osteoarthritis     Family History: Family History  Problem Relation Age of Onset  . Cancer Mother        Unknown origin  . Cancer Father        Lung    Social History   Socioeconomic History  . Marital status: Married    Spouse name: Not on file  . Number of children: Not on file  . Years of education: Not on file  . Highest education level: Not on file   Occupational History  . Not on file  Tobacco Use  . Smoking status: Former Smoker    Quit date: 02/18/2009    Years since quitting: 11.0  . Smokeless tobacco: Never Used  Substance and Sexual Activity  . Alcohol use: Yes    Alcohol/week: 0.0 standard drinks    Comment: RARE  . Drug use: No  . Sexual activity: Yes    Birth control/protection: Surgical    Comment: 1st intercourse- 34, partners- 5--BTL  Other Topics Concern  . Not on file  Social History Narrative  . Not on file   Social Determinants of Health   Financial Resource Strain:   . Difficulty of Paying Living Expenses:   Food Insecurity:   . Worried About Charity fundraiser in the Last Year:   . Arboriculturist in the Last Year:   Transportation Needs:   . Film/video editor (Medical):   Marland Kitchen Lack of Transportation (Non-Medical):   Physical Activity:   . Days of Exercise per Week:   . Minutes of Exercise per Session:   Stress:   . Feeling of Stress :   Social Connections:   . Frequency of Communication with Friends and Family:   . Frequency of Social Gatherings with  Friends and Family:   . Attends Religious Services:   . Active Member of Clubs or Organizations:   . Attends Banker Meetings:   Marland Kitchen Marital Status:   Intimate Partner Violence:   . Fear of Current or Ex-Partner:   . Emotionally Abused:   Marland Kitchen Physically Abused:   . Sexually Abused:       Review of Systems  Constitutional: Negative for chills, fatigue and unexpected weight change.  HENT: Negative for congestion, rhinorrhea, sneezing and sore throat.   Eyes: Negative for photophobia, pain and redness.  Respiratory: Negative for cough, chest tightness and shortness of breath.   Cardiovascular: Negative for chest pain and palpitations.  Gastrointestinal: Negative for abdominal pain, constipation, diarrhea, nausea and vomiting.  Endocrine: Negative.   Genitourinary: Negative for dysuria and frequency.  Musculoskeletal: Negative  for arthralgias, back pain, joint swelling and neck pain.  Skin: Negative for rash.  Allergic/Immunologic: Negative.   Neurological: Negative for tremors and numbness.  Hematological: Negative for adenopathy. Does not bruise/bleed easily.  Psychiatric/Behavioral: Negative for behavioral problems and sleep disturbance. The patient is not nervous/anxious.     Vital Signs: BP 130/79   Pulse 85   Temp 97.9 F (36.6 C)   Resp 16   Ht 5' (1.524 m)   Wt 162 lb 9.6 oz (73.8 kg)   SpO2 95%   BMI 31.76 kg/m    Physical Exam Vitals and nursing note reviewed.  Constitutional:      General: She is not in acute distress.    Appearance: She is well-developed. She is not diaphoretic.  HENT:     Head: Normocephalic and atraumatic.     Mouth/Throat:     Pharynx: No oropharyngeal exudate.  Eyes:     Pupils: Pupils are equal, round, and reactive to light.  Neck:     Thyroid: No thyromegaly.     Vascular: No JVD.     Trachea: No tracheal deviation.  Cardiovascular:     Rate and Rhythm: Normal rate and regular rhythm.     Heart sounds: Normal heart sounds. No murmur. No friction rub. No gallop.   Pulmonary:     Effort: Pulmonary effort is normal. No respiratory distress.     Breath sounds: Normal breath sounds. No wheezing or rales.  Chest:     Chest wall: No tenderness.  Abdominal:     Palpations: Abdomen is soft.     Tenderness: There is no abdominal tenderness. There is no guarding.  Musculoskeletal:        General: Normal range of motion.     Cervical back: Normal range of motion and neck supple.  Lymphadenopathy:     Cervical: No cervical adenopathy.  Skin:    General: Skin is warm and dry.  Neurological:     Mental Status: She is alert and oriented to person, place, and time.     Cranial Nerves: No cranial nerve deficit.  Psychiatric:        Behavior: Behavior normal.        Thought Content: Thought content normal.        Judgment: Judgment normal.     Assessment/Plan: 1. Other chronic pain Continue with her present management form PCP.  2. Adult BMI 31.0-31.9 kg/sq m There is a liability release in patients' chart. There has been a 10 minute discussion about the side effects including but not limited to elevated blood pressure, anxiety, lack of sleep and dry mouth. Pt understands and will like to  start/continue on appetite suppressant at this time. There will be one month RX given at the time of visit with proper follow up. Nova diet plan with restricted calories is given to the pt. Pt understands and agrees with  plan of treatment Obesity Counseling: Risk Assessment: An assessment of behavioral risk factors was made today and includes lack of exercise sedentary lifestyle, lack of portion control and poor dietary habits.  Risk Modification Advice: She was counseled on portion control guidelines. Restricting daily caloric intake to 1300. The detrimental long term effects of obesity on her health and ongoing poor compliance was also discussed with the patient.  - phentermine 37.5 MG capsule; Take 1 capsule (37.5 mg total) by mouth every morning.  Dispense: 30 capsule; Refill: 0  3. Generalized anxiety disorder Stable, continue present therapy.  General Counseling: Wende Mott understanding of the findings of todays visit and agrees with plan of treatment. I have discussed any further diagnostic evaluation that may be needed or ordered today. We also reviewed her medications today. she has been encouraged to call the office with any questions or concerns that should arise related to todays visit.    No orders of the defined types were placed in this encounter.   Meds ordered this encounter  Medications  . phentermine 37.5 MG capsule    Sig: Take 1 capsule (37.5 mg total) by mouth every morning.    Dispense:  30 capsule    Refill:  0    Time spent: 25 Minutes   This patient was seen by Blima Ledger AGNP-C in  Collaboration with Dr Lyndon Code as a part of collaborative care agreement     Johnna Acosta AGNP-C Internal medicine

## 2020-03-07 DIAGNOSIS — F329 Major depressive disorder, single episode, unspecified: Secondary | ICD-10-CM | POA: Diagnosis not present

## 2020-03-27 ENCOUNTER — Other Ambulatory Visit: Payer: Self-pay

## 2020-03-27 ENCOUNTER — Ambulatory Visit: Payer: Self-pay | Admitting: Adult Health

## 2020-03-27 ENCOUNTER — Encounter: Payer: Self-pay | Admitting: Adult Health

## 2020-03-27 VITALS — BP 112/70 | HR 88 | Temp 96.7°F | Resp 16 | Ht 60.0 in | Wt 159.2 lb

## 2020-03-27 DIAGNOSIS — Z6831 Body mass index (BMI) 31.0-31.9, adult: Secondary | ICD-10-CM

## 2020-03-27 MED ORDER — PHENTERMINE HCL 37.5 MG PO CAPS: 37.5000 mg | ORAL_CAPSULE | ORAL | 0 refills | Status: DC

## 2020-03-27 NOTE — Progress Notes (Signed)
[facility] [address] [place], [place] [address]  Internal MEDICINE  Office Visit Note  Patient Name: Alicia Bowman  [id number]  [id number]  Date of Service: 03/27/2020  Chief Complaint  Patient presents with  . Follow-up    weight loss    HPI  Patient is using phentermine for weight loss.  Since our last visit they have lost 3 pounds.  The patient denies any chest pain, palpitations, shortness of breath, constipation, headaches or any other side effects of the medication.  Patient wishes to continue to use this medication for weight loss at this time.   Current Medication: Outpatient Encounter Medications as of 03/27/2020  Medication Sig  . HYDROcodone-acetaminophen (NORCO) 7.5-325 MG per tablet Take 1 tablet by mouth every 6 (six) hours as needed for moderate pain.  . Linaclotide (LINZESS) 145 MCG CAPS capsule Take 1 capsule (145 mcg total) by mouth daily.  Marland Kitchen LORazepam (ATIVAN) 0.5 MG tablet Take 0.5 mg by mouth every 8 (eight) hours.  . methocarbamol (ROBAXIN) 500 MG tablet Take 500 mg by mouth 4 (four) times daily.  . [DISCONTINUED] phentermine 37.5 MG capsule Take 1 capsule (37.5 mg total) by mouth every morning.  . phentermine 37.5 MG capsule Take 1 capsule (37.5 mg total) by mouth every morning.   No facility-administered encounter medications on file as of 03/27/2020.    Surgical History: Past Surgical History:  Procedure Laterality Date  . AUGMENTATION MAMMAPLASTY     saline  . BREAST SURGERY    . DILATION AND CURETTAGE OF UTERUS    . ENDOMETRIAL ABLATION    . FOOT SURGERY Bilateral   . HAND SURGERY Right   . TUBAL LIGATION      Medical History: Past Medical History:  Diagnosis Date  . Osteoarthritis     Family History: Family History  Problem Relation Age of Onset  . Cancer Mother        Unknown origin  . Cancer Father        Lung    Social History   Socioeconomic History  . Marital status: Married    Spouse name: Not on file   . Number of children: Not on file  . Years of education: Not on file  . Highest education level: Not on file  Occupational History  . Not on file  Tobacco Use  . Smoking status: Former Smoker    Quit date: 02/18/2009    Years since quitting: 11.1  . Smokeless tobacco: Never Used  Substance and Sexual Activity  . Alcohol use: Yes    Alcohol/week: 0.0 standard drinks    Comment: RARE  . Drug use: No  . Sexual activity: Yes    Birth control/protection: Surgical    Comment: 1st intercourse- 16, partners- 5--BTL  Other Topics Concern  . Not on file  Social History Narrative  . Not on file   Social Determinants of Health   Financial Resource Strain:   . Difficulty of Paying Living Expenses:   Food Insecurity:   . Worried About Programme researcher, broadcasting/film/video in the Last Year:   . Barista in the Last Year:   Transportation Needs:   . Freight forwarder (Medical):   Marland Kitchen Lack of Transportation (Non-Medical):   Physical Activity:   . Days of Exercise per Week:   . Minutes of Exercise per Session:   Stress:   . Feeling of Stress :   Social Connections:   . Frequency of Communication with  Friends and Family:   . Frequency of Social Gatherings with Friends and Family:   . Attends Religious Services:   . Active Member of Clubs or Organizations:   . Attends Archivist Meetings:   Marland Kitchen Marital Status:   Intimate Partner Violence:   . Fear of Current or Ex-Partner:   . Emotionally Abused:   Marland Kitchen Physically Abused:   . Sexually Abused:       Review of Systems  Constitutional: Negative for chills, fatigue and unexpected weight change.  HENT: Negative for congestion, rhinorrhea, sneezing and sore throat.   Eyes: Negative for photophobia, pain and redness.  Respiratory: Negative for cough, chest tightness and shortness of breath.   Cardiovascular: Negative for chest pain and palpitations.  Gastrointestinal: Negative for abdominal pain, constipation, diarrhea, nausea and  vomiting.  Endocrine: Negative.   Genitourinary: Negative for dysuria and frequency.  Musculoskeletal: Negative for arthralgias, back pain, joint swelling and neck pain.  Skin: Negative for rash.  Allergic/Immunologic: Negative.   Neurological: Negative for tremors and numbness.  Hematological: Negative for adenopathy. Does not bruise/bleed easily.  Psychiatric/Behavioral: Negative for behavioral problems and sleep disturbance. The patient is not nervous/anxious.     Vital Signs: BP 112/70   Pulse 88   Temp (!) 96.7 F (35.9 C)   Resp 16   Ht 5' (1.524 m)   Wt 159 lb 3.2 oz (72.2 kg)   SpO2 98%   BMI 31.09 kg/m    Physical Exam Vitals and nursing note reviewed.  Constitutional:      General: She is not in acute distress.    Appearance: She is well-developed. She is not diaphoretic.  HENT:     Head: Normocephalic and atraumatic.     Mouth/Throat:     Pharynx: No oropharyngeal exudate.  Eyes:     Pupils: Pupils are equal, round, and reactive to light.  Neck:     Thyroid: No thyromegaly.     Vascular: No JVD.     Trachea: No tracheal deviation.  Cardiovascular:     Rate and Rhythm: Normal rate and regular rhythm.     Heart sounds: Normal heart sounds. No murmur. No friction rub. No gallop.   Pulmonary:     Effort: Pulmonary effort is normal. No respiratory distress.     Breath sounds: Normal breath sounds. No wheezing or rales.  Chest:     Chest wall: No tenderness.  Abdominal:     Palpations: Abdomen is soft.     Tenderness: There is no abdominal tenderness. There is no guarding.  Musculoskeletal:        General: Normal range of motion.     Cervical back: Normal range of motion and neck supple.  Lymphadenopathy:     Cervical: No cervical adenopathy.  Skin:    General: Skin is warm and dry.  Neurological:     Mental Status: She is alert and oriented to person, place, and time.     Cranial Nerves: No cranial nerve deficit.  Psychiatric:        Behavior:  Behavior normal.        Thought Content: Thought content normal.        Judgment: Judgment normal.     Assessment/Plan: 1. Adult BMI 31.0-31.9 kg/sq m Obesity Counseling: Risk Assessment: An assessment of behavioral risk factors was made today and includes lack of exercise sedentary lifestyle, lack of portion control and poor dietary habits.  Risk Modification Advice: She was counseled on portion control guidelines.  Restricting daily caloric intake to 1300. The detrimental long term effects of obesity on her health and ongoing poor compliance was also discussed with the patient.  There is a liability release in patients' chart. There has been a 10 minute discussion about the side effects including but not limited to elevated blood pressure, anxiety, lack of sleep and dry mouth. Pt understands and will like to start/continue on appetite suppressant at this time. There will be one month RX given at the time of visit with proper follow up. Nova diet plan with restricted calories is given to the pt. Pt understands and agrees with  plan of treatment - phentermine 37.5 MG capsule; Take 1 capsule (37.5 mg total) by mouth every morning.  Dispense: 30 capsule; Refill: 0  General Counseling: Tresa Endo verbalizes understanding of the findings of todays visit and agrees with plan of treatment. I have discussed any further diagnostic evaluation that may be needed or ordered today. We also reviewed her medications today. she has been encouraged to call the office with any questions or concerns that should arise related to todays visit.    No orders of the defined types were placed in this encounter.   Meds ordered this encounter  Medications  . phentermine 37.5 MG capsule    Sig: Take 1 capsule (37.5 mg total) by mouth every morning.    Dispense:  30 capsule    Refill:  0    Time spent: 25 Minutes   This patient was seen by Blima Ledger AGNP-C in Collaboration with Dr Lyndon Code as a part of  collaborative care agreement     Johnna Acosta AGNP-C Internal medicine

## 2020-04-01 DIAGNOSIS — F329 Major depressive disorder, single episode, unspecified: Secondary | ICD-10-CM | POA: Diagnosis not present

## 2020-04-24 ENCOUNTER — Ambulatory Visit: Payer: Self-pay | Admitting: Adult Health

## 2020-04-24 ENCOUNTER — Encounter: Payer: Self-pay | Admitting: Adult Health

## 2020-04-24 ENCOUNTER — Other Ambulatory Visit: Payer: Self-pay

## 2020-04-24 VITALS — BP 136/74 | HR 85 | Temp 97.2°F | Resp 16 | Ht 60.0 in | Wt 159.8 lb

## 2020-04-24 DIAGNOSIS — Z6831 Body mass index (BMI) 31.0-31.9, adult: Secondary | ICD-10-CM

## 2020-04-24 DIAGNOSIS — F411 Generalized anxiety disorder: Secondary | ICD-10-CM

## 2020-04-24 MED ORDER — PHENTERMINE HCL 37.5 MG PO CAPS: 37.5000 mg | ORAL_CAPSULE | ORAL | 0 refills | Status: DC

## 2020-04-24 NOTE — Progress Notes (Signed)
[facility] [place], [place] [address]  Internal MEDICINE  Office Visit Note  Patient Name: Alicia Bowman  [id number]  [id number]  Date of Service: 04/24/2020  Chief Complaint  Patient presents with  . Follow-up    wt loss    HPI  Pt is here for follow up on weight loss. Patient is using phentermine for weight loss.  Since our last visit they have lost 0 pounds.  The patient denies any chest pain, palpitations, shortness of breath, constipation, headaches or any other side effects of the medication.  Patient wishes to continue to use this medication for weight loss at this time. She has not gained any weight.     Current Medication: Outpatient Encounter Medications as of 04/24/2020  Medication Sig  . HYDROcodone-acetaminophen (NORCO) 7.5-325 MG per tablet Take 1 tablet by mouth every 6 (six) hours as needed for moderate pain.  . Linaclotide (LINZESS) 145 MCG CAPS capsule Take 1 capsule (145 mcg total) by mouth daily.  Marland Kitchen LORazepam (ATIVAN) 0.5 MG tablet Take 0.5 mg by mouth every 8 (eight) hours.  . methocarbamol (ROBAXIN) 500 MG tablet Take 500 mg by mouth 4 (four) times daily.  . phentermine 37.5 MG capsule Take 1 capsule (37.5 mg total) by mouth every morning.  . [DISCONTINUED] phentermine 37.5 MG capsule Take 1 capsule (37.5 mg total) by mouth every morning.   No facility-administered encounter medications on file as of 04/24/2020.    Surgical History: Past Surgical History:  Procedure Laterality Date  . AUGMENTATION MAMMAPLASTY     saline  . BREAST SURGERY    . DILATION AND CURETTAGE OF UTERUS    . ENDOMETRIAL ABLATION    . FOOT SURGERY Bilateral   . HAND SURGERY Right   . TUBAL LIGATION      Medical History: Past Medical History:  Diagnosis Date  . Osteoarthritis     Family History: Family History  Problem Relation Age of Onset  . Cancer Mother        Unknown origin  . Cancer Father        Lung    Social History    Socioeconomic History  . Marital status: Married    Spouse name: Not on file  . Number of children: Not on file  . Years of education: Not on file  . Highest education level: Not on file  Occupational History  . Not on file  Tobacco Use  . Smoking status: Former Smoker    Quit date: 02/18/2009    Years since quitting: 11.1  . Smokeless tobacco: Never Used  Substance and Sexual Activity  . Alcohol use: Yes    Alcohol/week: 0.0 standard drinks    Comment: RARE  . Drug use: No  . Sexual activity: Yes    Birth control/protection: Surgical    Comment: 1st intercourse- 10, partners- 5--BTL  Other Topics Concern  . Not on file  Social History Narrative  . Not on file   Social Determinants of Health   Financial Resource Strain:   . Difficulty of Paying Living Expenses:   Food Insecurity:   . Worried About Charity fundraiser in the Last Year:   . Arboriculturist in the Last Year:   Transportation Needs:   . Film/video editor (Medical):   Marland Kitchen Lack of Transportation (Non-Medical):   Physical Activity:   . Days of Exercise per Week:   . Minutes of Exercise per Session:   Stress:   .  Feeling of Stress :   Social Connections:   . Frequency of Communication with Friends and Family:   . Frequency of Social Gatherings with Friends and Family:   . Attends Religious Services:   . Active Member of Clubs or Organizations:   . Attends Banker Meetings:   Marland Kitchen Marital Status:   Intimate Partner Violence:   . Fear of Current or Ex-Partner:   . Emotionally Abused:   Marland Kitchen Physically Abused:   . Sexually Abused:       Review of Systems  Constitutional: Negative for chills, fatigue and unexpected weight change.  HENT: Negative for congestion, rhinorrhea, sneezing and sore throat.   Eyes: Negative for photophobia, pain and redness.  Respiratory: Negative for cough, chest tightness and shortness of breath.   Cardiovascular: Negative for chest pain and palpitations.   Gastrointestinal: Negative for abdominal pain, constipation, diarrhea, nausea and vomiting.  Endocrine: Negative.   Genitourinary: Negative for dysuria and frequency.  Musculoskeletal: Negative for arthralgias, back pain, joint swelling and neck pain.  Skin: Negative for rash.  Allergic/Immunologic: Negative.   Neurological: Negative for tremors and numbness.  Hematological: Negative for adenopathy. Does not bruise/bleed easily.  Psychiatric/Behavioral: Negative for behavioral problems and sleep disturbance. The patient is not nervous/anxious.     Vital Signs: BP 136/74   Pulse 85   Temp (!) 97.2 F (36.2 C)   Resp 16   Ht 5' (1.524 m)   Wt 159 lb 12.8 oz (72.5 kg)   SpO2 96%   BMI 31.21 kg/m    Physical Exam Vitals and nursing note reviewed.  Constitutional:      General: She is not in acute distress.    Appearance: She is well-developed. She is not diaphoretic.  HENT:     Head: Normocephalic and atraumatic.     Mouth/Throat:     Pharynx: No oropharyngeal exudate.  Eyes:     Pupils: Pupils are equal, round, and reactive to light.  Neck:     Thyroid: No thyromegaly.     Vascular: No JVD.     Trachea: No tracheal deviation.  Cardiovascular:     Rate and Rhythm: Normal rate and regular rhythm.     Heart sounds: Normal heart sounds. No murmur. No friction rub. No gallop.   Pulmonary:     Effort: Pulmonary effort is normal. No respiratory distress.     Breath sounds: Normal breath sounds. No wheezing or rales.  Chest:     Chest wall: No tenderness.  Abdominal:     Palpations: Abdomen is soft.     Tenderness: There is no abdominal tenderness. There is no guarding.  Musculoskeletal:        General: Normal range of motion.     Cervical back: Normal range of motion and neck supple.  Lymphadenopathy:     Cervical: No cervical adenopathy.  Skin:    General: Skin is warm and dry.  Neurological:     Mental Status: She is alert and oriented to person, place, and time.      Cranial Nerves: No cranial nerve deficit.  Psychiatric:        Behavior: Behavior normal.        Thought Content: Thought content normal.        Judgment: Judgment normal.    Assessment/Plan: 1. Adult BMI 31.0-31.9 kg/sq m Obesity Counseling: Risk Assessment: An assessment of behavioral risk factors was made today and includes lack of exercise sedentary lifestyle, lack of portion control and  poor dietary habits.  Risk Modification Advice: She was counseled on portion control guidelines. Restricting daily caloric intake to 1500. The detrimental long term effects of obesity on her health and ongoing poor compliance was also discussed with the patient.  - phentermine 37.5 MG capsule; Take 1 capsule (37.5 mg total) by mouth every morning.  Dispense: 30 capsule; Refill: 0  2. Generalized anxiety disorder Controlled.continue current management via PCP  General Counseling: Wende Mott understanding of the findings of todays visit and agrees with plan of treatment. I have discussed any further diagnostic evaluation that may be needed or ordered today. We also reviewed her medications today. she has been encouraged to call the office with any questions or concerns that should arise related to todays visit.    No orders of the defined types were placed in this encounter.   Meds ordered this encounter  Medications  . phentermine 37.5 MG capsule    Sig: Take 1 capsule (37.5 mg total) by mouth every morning.    Dispense:  30 capsule    Refill:  0    Time spent: 30 Minutes   This patient was seen by Blima Ledger AGNP-C in Collaboration with Dr Lyndon Code as a part of collaborative care agreement     Johnna Acosta AGNP-C Internal medicine

## 2020-05-01 DIAGNOSIS — F329 Major depressive disorder, single episode, unspecified: Secondary | ICD-10-CM | POA: Diagnosis not present

## 2020-05-07 DIAGNOSIS — F329 Major depressive disorder, single episode, unspecified: Secondary | ICD-10-CM | POA: Diagnosis not present

## 2020-05-14 DIAGNOSIS — F329 Major depressive disorder, single episode, unspecified: Secondary | ICD-10-CM | POA: Diagnosis not present

## 2020-05-21 DIAGNOSIS — F329 Major depressive disorder, single episode, unspecified: Secondary | ICD-10-CM | POA: Diagnosis not present

## 2020-05-22 ENCOUNTER — Ambulatory Visit: Payer: BC Managed Care – PPO | Admitting: Adult Health

## 2020-05-28 DIAGNOSIS — F329 Major depressive disorder, single episode, unspecified: Secondary | ICD-10-CM | POA: Diagnosis not present

## 2020-05-29 ENCOUNTER — Ambulatory Visit: Payer: BC Managed Care – PPO | Admitting: Adult Health

## 2020-05-29 DIAGNOSIS — F419 Anxiety disorder, unspecified: Secondary | ICD-10-CM | POA: Diagnosis not present

## 2020-05-29 DIAGNOSIS — M79672 Pain in left foot: Secondary | ICD-10-CM | POA: Diagnosis not present

## 2020-05-29 DIAGNOSIS — G8929 Other chronic pain: Secondary | ICD-10-CM | POA: Diagnosis not present

## 2020-05-29 DIAGNOSIS — F324 Major depressive disorder, single episode, in partial remission: Secondary | ICD-10-CM | POA: Diagnosis not present

## 2020-05-29 DIAGNOSIS — Z23 Encounter for immunization: Secondary | ICD-10-CM | POA: Diagnosis not present

## 2020-06-05 ENCOUNTER — Other Ambulatory Visit: Payer: Self-pay

## 2020-06-05 ENCOUNTER — Ambulatory Visit: Payer: Self-pay | Admitting: Adult Health

## 2020-06-05 ENCOUNTER — Encounter: Payer: Self-pay | Admitting: Adult Health

## 2020-06-05 VITALS — BP 130/72 | HR 82 | Temp 97.9°F | Resp 16 | Ht 60.0 in | Wt 162.0 lb

## 2020-06-05 DIAGNOSIS — Z6831 Body mass index (BMI) 31.0-31.9, adult: Secondary | ICD-10-CM

## 2020-06-05 DIAGNOSIS — F411 Generalized anxiety disorder: Secondary | ICD-10-CM

## 2020-06-05 MED ORDER — PHENTERMINE HCL 37.5 MG PO CAPS: 37.5000 mg | ORAL_CAPSULE | ORAL | 0 refills | Status: AC

## 2020-06-05 NOTE — Progress Notes (Signed)
[facility] [address] [place], [place] [address]  Internal MEDICINE  Office Visit Note  Patient Name: Alicia Bowman  [id number]  [id number]  Date of Service: 06/05/2020  Chief Complaint  Patient presents with   Follow-up    wt loss    HPI  Pt is here for weight loss.  She reports she has been having issues working out because of her back and hip pain. Patient is using phentermine for weight loss.  Since our last visit they have gained 3  pounds.  The patient denies any chest pain, palpitations, shortness of breath, constipation, headaches or any other side effects of the medication.  Patient wishes to continue to use this medication for weight loss at this time.    Current Medication: Outpatient Encounter Medications as of 06/05/2020  Medication Sig   HYDROcodone-acetaminophen (NORCO) 7.5-325 MG per tablet Take 1 tablet by mouth every 6 (six) hours as needed for moderate pain.   Linaclotide (LINZESS) 145 MCG CAPS capsule Take 1 capsule (145 mcg total) by mouth daily.   LORazepam (ATIVAN) 0.5 MG tablet Take 0.5 mg by mouth every 8 (eight) hours.   methocarbamol (ROBAXIN) 500 MG tablet Take 500 mg by mouth 4 (four) times daily.   phentermine 37.5 MG capsule Take 1 capsule (37.5 mg total) by mouth every morning.   [DISCONTINUED] phentermine 37.5 MG capsule Take 1 capsule (37.5 mg total) by mouth every morning.   No facility-administered encounter medications on file as of 06/05/2020.    Surgical History: Past Surgical History:  Procedure Laterality Date   AUGMENTATION MAMMAPLASTY     saline   BREAST SURGERY     DILATION AND CURETTAGE OF UTERUS     ENDOMETRIAL ABLATION     FOOT SURGERY Bilateral    HAND SURGERY Right    TUBAL LIGATION      Medical History: Past Medical History:  Diagnosis Date   Osteoarthritis     Family History: Family History  Problem Relation Age of Onset   Cancer Mother        Unknown origin   Cancer Father         Lung    Social History   Socioeconomic History   Marital status: Married    Spouse name: Not on file   Number of children: Not on file   Years of education: Not on file   Highest education level: Not on file  Occupational History   Not on file  Tobacco Use   Smoking status: Former Smoker    Quit date: 02/18/2009    Years since quitting: 11.3   Smokeless tobacco: Never Used  Vaping Use   Vaping Use: Never used  Substance and Sexual Activity   Alcohol use: Yes    Alcohol/week: 0.0 standard drinks    Comment: RARE   Drug use: No   Sexual activity: Yes    Birth control/protection: Surgical    Comment: 1st intercourse- 16, partners- 5--BTL  Other Topics Concern   Not on file  Social History Narrative   Not on file   Social Determinants of Health   Financial Resource Strain:    Difficulty of Paying Living Expenses:   Food Insecurity:    Worried About Programme researcher, broadcasting/film/video in the Last Year:    Barista in the Last Year:   Transportation Needs:    Freight forwarder (Medical):    Lack of Transportation (Non-Medical):   Physical Activity:  Days of Exercise per Week:    Minutes of Exercise per Session:   Stress:    Feeling of Stress :   Social Connections:    Frequency of Communication with Friends and Family:    Frequency of Social Gatherings with Friends and Family:    Attends Religious Services:    Active Member of Clubs or Organizations:    Attends Engineer, structural:    Marital Status:   Intimate Partner Violence:    Fear of Current or Ex-Partner:    Emotionally Abused:    Physically Abused:    Sexually Abused:       Review of Systems  Constitutional: Negative for chills, fatigue and unexpected weight change.  HENT: Negative for congestion, rhinorrhea, sneezing and sore throat.   Eyes: Negative for photophobia, pain and redness.  Respiratory: Negative for cough, chest tightness and shortness of breath.    Cardiovascular: Negative for chest pain and palpitations.  Gastrointestinal: Negative for abdominal pain, constipation, diarrhea, nausea and vomiting.  Endocrine: Negative.   Genitourinary: Negative for dysuria and frequency.  Musculoskeletal: Negative for arthralgias, back pain, joint swelling and neck pain.  Skin: Negative for rash.  Allergic/Immunologic: Negative.   Neurological: Negative for tremors and numbness.  Hematological: Negative for adenopathy. Does not bruise/bleed easily.  Psychiatric/Behavioral: Negative for behavioral problems and sleep disturbance. The patient is not nervous/anxious.     Vital Signs: BP 130/72    Pulse 82    Temp 97.9 F (36.6 C)    Resp 16    Ht 5' (1.524 m)    Wt 162 lb (73.5 kg)    SpO2 100%    BMI 31.64 kg/m    Physical Exam Vitals and nursing note reviewed.  Constitutional:      General: She is not in acute distress.    Appearance: She is well-developed. She is not diaphoretic.  HENT:     Head: Normocephalic and atraumatic.     Mouth/Throat:     Pharynx: No oropharyngeal exudate.  Eyes:     Pupils: Pupils are equal, round, and reactive to light.  Neck:     Thyroid: No thyromegaly.     Vascular: No JVD.     Trachea: No tracheal deviation.  Cardiovascular:     Rate and Rhythm: Normal rate and regular rhythm.     Heart sounds: Normal heart sounds. No murmur heard.  No friction rub. No gallop.   Pulmonary:     Effort: Pulmonary effort is normal. No respiratory distress.     Breath sounds: Normal breath sounds. No wheezing or rales.  Chest:     Chest wall: No tenderness.  Abdominal:     Palpations: Abdomen is soft.     Tenderness: There is no abdominal tenderness. There is no guarding.  Musculoskeletal:        General: Normal range of motion.     Cervical back: Normal range of motion and neck supple.  Lymphadenopathy:     Cervical: No cervical adenopathy.  Skin:    General: Skin is warm and dry.  Neurological:     Mental  Status: She is alert and oriented to person, place, and time.     Cranial Nerves: No cranial nerve deficit.  Psychiatric:        Behavior: Behavior normal.        Thought Content: Thought content normal.        Judgment: Judgment normal.    Assessment/Plan: 1. Adult BMI 31.0-31.9 kg/sq m  There is a liability release in patients' chart. There has been a 10 minute discussion about the side effects including but not limited to elevated blood pressure, anxiety, lack of sleep and dry mouth. Pt understands and will like to start/continue on appetite suppressant at this time. There will be one month RX given at the time of visit with proper follow up. Nova diet plan with restricted calories is given to the pt. Pt understands and agrees with  plan of treatment Obesity Counseling: Risk Assessment: An assessment of behavioral risk factors was made today and includes lack of exercise sedentary lifestyle, lack of portion control and poor dietary habits.  Risk Modification Advice: She was counseled on portion control guidelines. Restricting daily caloric intake to 1400. The detrimental long term effects of obesity on her health and ongoing poor compliance was also discussed with the patient.  - phentermine 37.5 MG capsule; Take 1 capsule (37.5 mg total) by mouth every morning.  Dispense: 30 capsule; Refill: 0  2. Generalized anxiety disorder No recent issues.   General Counseling: Wende Mott understanding of the findings of todays visit and agrees with plan of treatment. I have discussed any further diagnostic evaluation that may be needed or ordered today. We also reviewed her medications today. she has been encouraged to call the office with any questions or concerns that should arise related to todays visit.    No orders of the defined types were placed in this encounter.   Meds ordered this encounter  Medications   phentermine 37.5 MG capsule    Sig: Take 1 capsule (37.5 mg total) by  mouth every morning.    Dispense:  30 capsule    Refill:  0    Time spent: 20 Minutes   This patient was seen by Blima Ledger AGNP-C in Collaboration with Dr Lyndon Code as a part of collaborative care agreement     Johnna Acosta AGNP-C Internal medicine

## 2020-06-18 DIAGNOSIS — F329 Major depressive disorder, single episode, unspecified: Secondary | ICD-10-CM | POA: Diagnosis not present

## 2020-07-02 DIAGNOSIS — F329 Major depressive disorder, single episode, unspecified: Secondary | ICD-10-CM | POA: Diagnosis not present

## 2020-07-03 ENCOUNTER — Ambulatory Visit: Payer: BC Managed Care – PPO | Admitting: Adult Health

## 2020-07-09 DIAGNOSIS — F329 Major depressive disorder, single episode, unspecified: Secondary | ICD-10-CM | POA: Diagnosis not present

## 2020-07-15 IMAGING — US US CAROTID DUPLEX BILAT
1 series · 13 of 24 positions shown · non-contrast
Comparison: None.

CLINICAL DATA: 52-year-old female with a history of carotid
calcification

EXAM:
BILATERAL CAROTID DUPLEX ULTRASOUND
TECHNIQUE: Gray scale imaging, color Doppler and duplex ultrasound were
performed of bilateral carotid and vertebral arteries in the neck.

[Series 1: us carotid duplex bilat · 0.05mm/px · 13 of 44 slices shown]
[im 1/44]
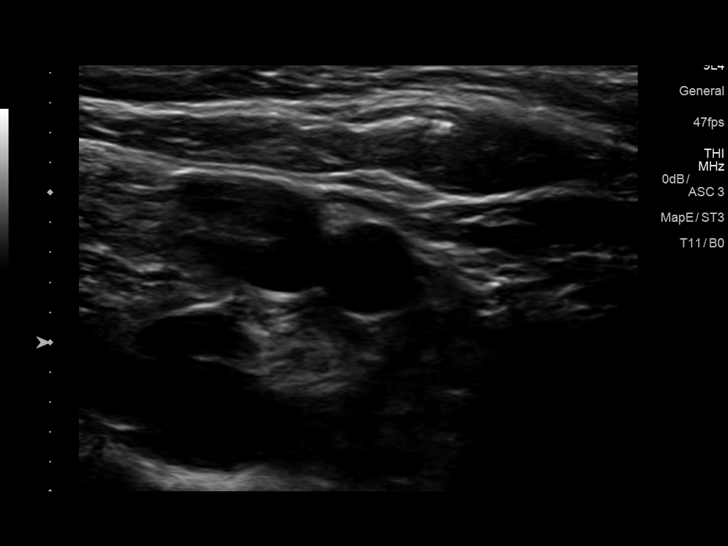
[im 4/44]
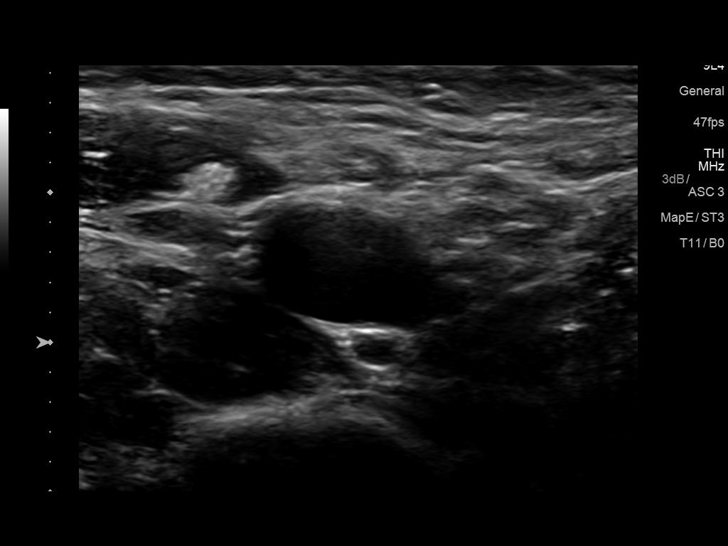
[im 8/44]
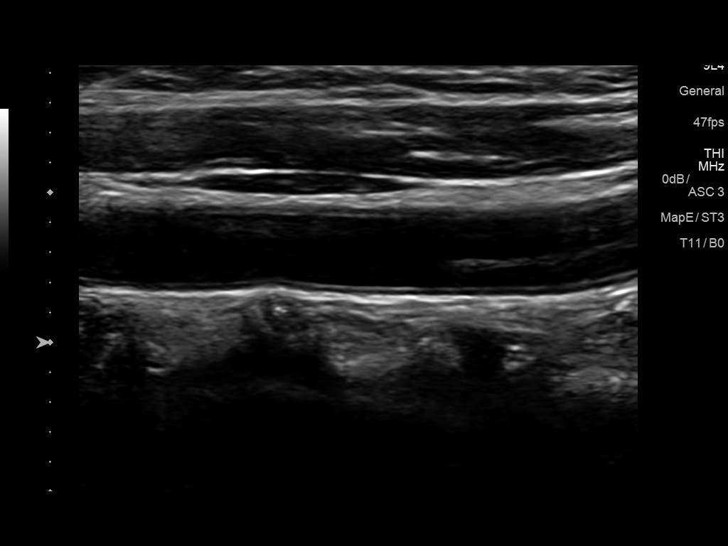
[im 12/44]
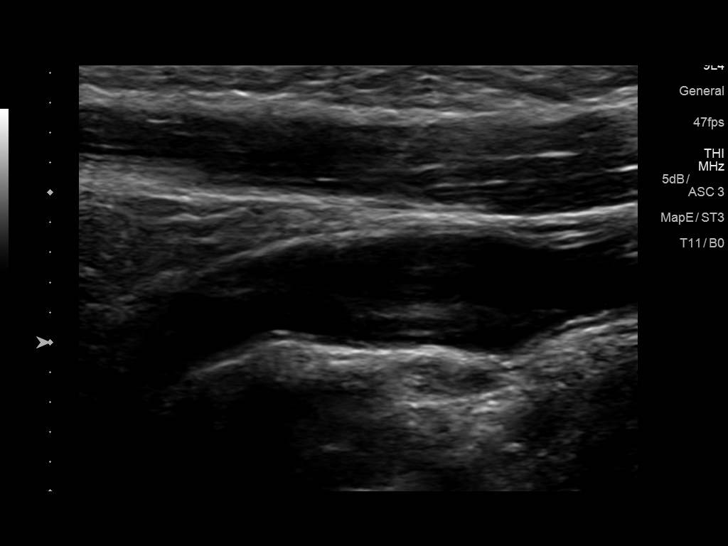
[im 15/44]
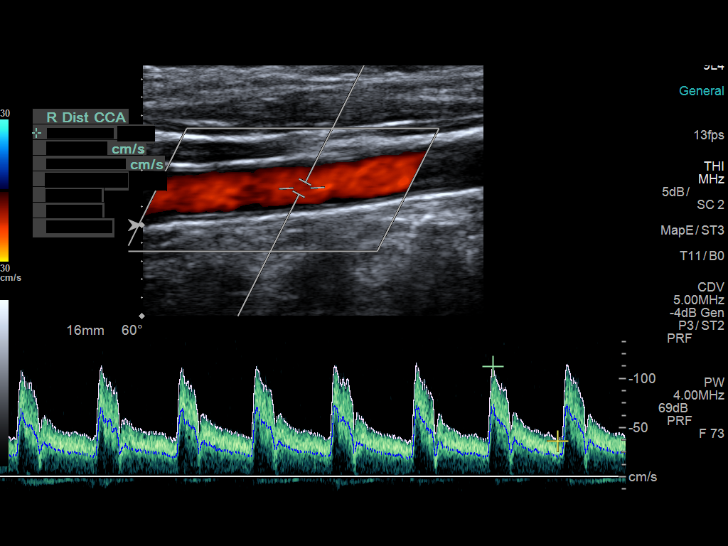
[im 19/44]
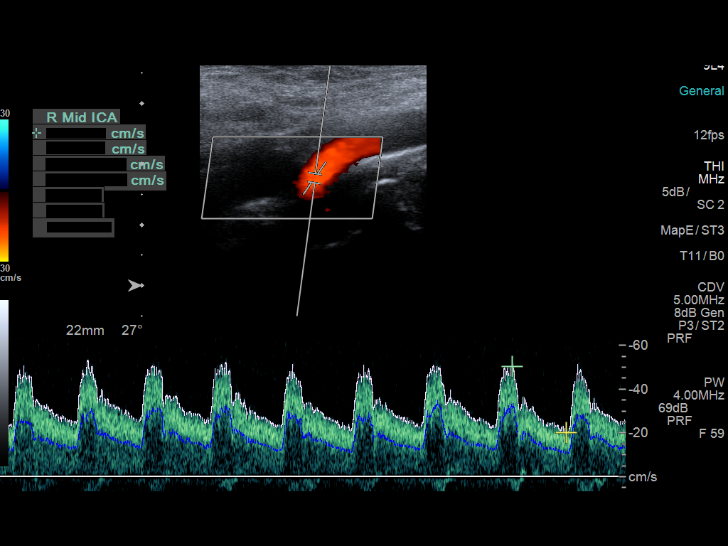
[im 23/44]
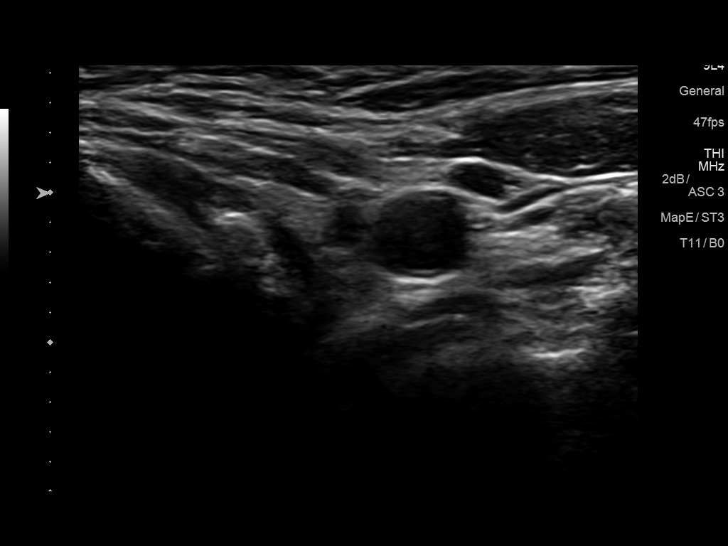
[im 25/44]
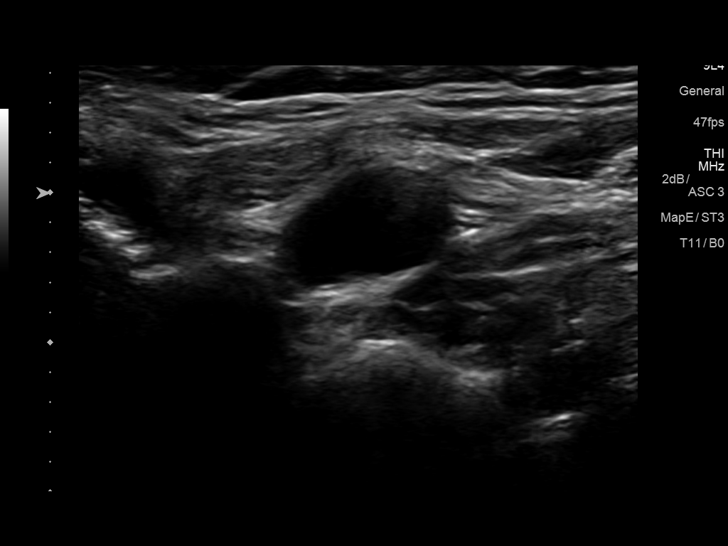
[im 29/44]
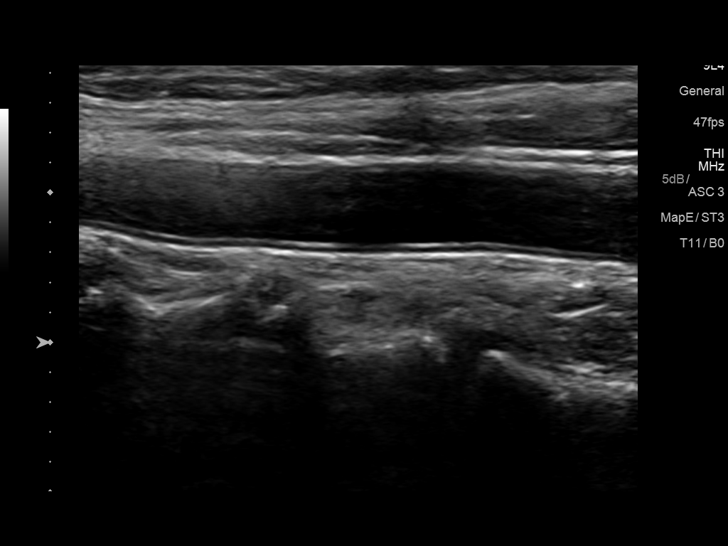
[im 32/44]
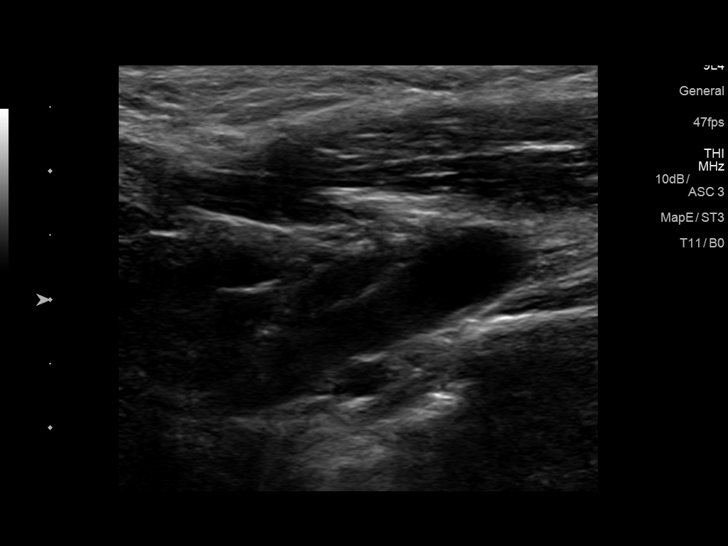
[im 36/44]
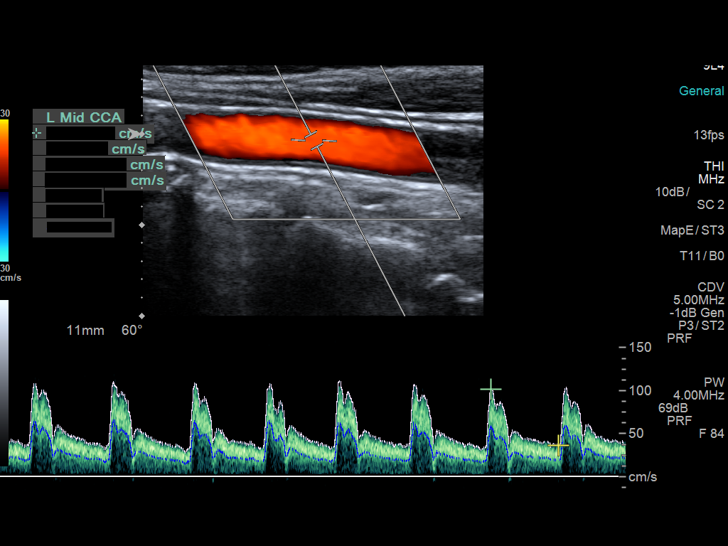
[im 40/44]
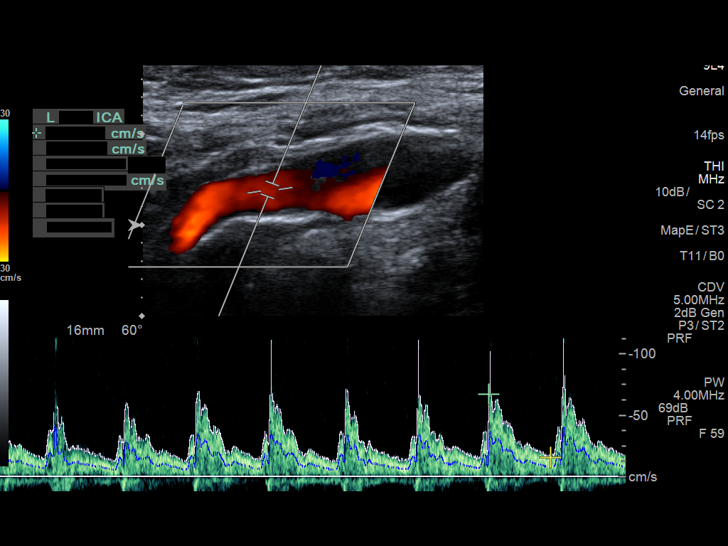
[im 44/44]
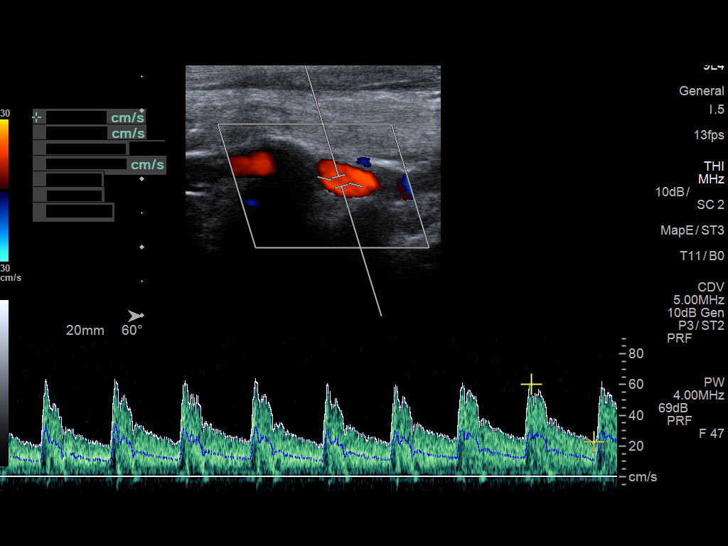

[13 of 24 positions shown; findings below may reference images not displayed]

FINDINGS: Criteria: Quantification of carotid stenosis is based on velocity
parameters that correlate the residual internal carotid diameter
with NASCET-based stenosis levels, using the diameter of the distal
internal carotid lumen as the denominator for stenosis measurement.

The following velocity measurements were obtained:

RIGHT

ICA:  Systolic 101 cm/sec, Diastolic 30 cm/sec

CCA:  112 cm/sec

SYSTOLIC ICA/CCA RATIO:

ECA:  94 cm/sec

LEFT

ICA:  Systolic 76 cm/sec, Diastolic 31 cm/sec

CCA:  116 cm/sec

SYSTOLIC ICA/CCA RATIO:

ECA:  112 cm/sec

Right Brachial SBP: 122

Left Brachial SBP: 126

RIGHT CAROTID ARTERY: No significant calcifications of the right
common carotid artery. Intermediate waveform maintained.
Heterogeneous and partially calcified plaque at the right carotid
bifurcation. No significant lumen shadowing. Low resistance waveform
of the right ICA. No significant tortuosity.

RIGHT VERTEBRAL ARTERY: Antegrade flow with low resistance waveform.

LEFT CAROTID ARTERY: No significant calcifications of the left
common carotid artery. Intermediate waveform maintained.
Heterogeneous and partially calcified plaque at the left carotid
bifurcation without significant lumen shadowing. Low resistance
waveform of the left ICA. No significant tortuosity.

LEFT VERTEBRAL ARTERY:  Antegrade flow with low resistance waveform.
IMPRESSION: Color duplex indicates minimal heterogeneous and calcified plaque,
with no hemodynamically significant stenosis by duplex criteria in
the extracranial cerebrovascular circulation.

## 2020-07-16 DIAGNOSIS — F329 Major depressive disorder, single episode, unspecified: Secondary | ICD-10-CM | POA: Diagnosis not present

## 2020-07-30 DIAGNOSIS — F329 Major depressive disorder, single episode, unspecified: Secondary | ICD-10-CM | POA: Diagnosis not present

## 2020-08-06 DIAGNOSIS — F329 Major depressive disorder, single episode, unspecified: Secondary | ICD-10-CM | POA: Diagnosis not present

## 2020-08-12 DIAGNOSIS — D485 Neoplasm of uncertain behavior of skin: Secondary | ICD-10-CM | POA: Diagnosis not present

## 2020-08-12 DIAGNOSIS — B079 Viral wart, unspecified: Secondary | ICD-10-CM | POA: Diagnosis not present

## 2020-08-12 DIAGNOSIS — L814 Other melanin hyperpigmentation: Secondary | ICD-10-CM | POA: Diagnosis not present

## 2020-09-03 DIAGNOSIS — F329 Major depressive disorder, single episode, unspecified: Secondary | ICD-10-CM | POA: Diagnosis not present

## 2020-09-10 DIAGNOSIS — F329 Major depressive disorder, single episode, unspecified: Secondary | ICD-10-CM | POA: Diagnosis not present

## 2020-10-08 DIAGNOSIS — F329 Major depressive disorder, single episode, unspecified: Secondary | ICD-10-CM | POA: Diagnosis not present

## 2020-10-15 DIAGNOSIS — F329 Major depressive disorder, single episode, unspecified: Secondary | ICD-10-CM | POA: Diagnosis not present

## 2020-10-22 DIAGNOSIS — F329 Major depressive disorder, single episode, unspecified: Secondary | ICD-10-CM | POA: Diagnosis not present

## 2020-10-29 DIAGNOSIS — F329 Major depressive disorder, single episode, unspecified: Secondary | ICD-10-CM | POA: Diagnosis not present

## 2020-10-30 DIAGNOSIS — F324 Major depressive disorder, single episode, in partial remission: Secondary | ICD-10-CM | POA: Diagnosis not present

## 2020-10-30 DIAGNOSIS — E7800 Pure hypercholesterolemia, unspecified: Secondary | ICD-10-CM | POA: Diagnosis not present

## 2020-10-30 DIAGNOSIS — Z23 Encounter for immunization: Secondary | ICD-10-CM | POA: Diagnosis not present

## 2020-10-30 DIAGNOSIS — F419 Anxiety disorder, unspecified: Secondary | ICD-10-CM | POA: Diagnosis not present

## 2020-10-30 DIAGNOSIS — G8929 Other chronic pain: Secondary | ICD-10-CM | POA: Diagnosis not present

## 2020-11-26 DIAGNOSIS — F329 Major depressive disorder, single episode, unspecified: Secondary | ICD-10-CM | POA: Diagnosis not present

## 2020-12-03 DIAGNOSIS — F329 Major depressive disorder, single episode, unspecified: Secondary | ICD-10-CM | POA: Diagnosis not present

## 2020-12-17 DIAGNOSIS — F329 Major depressive disorder, single episode, unspecified: Secondary | ICD-10-CM | POA: Diagnosis not present

## 2020-12-31 DIAGNOSIS — F329 Major depressive disorder, single episode, unspecified: Secondary | ICD-10-CM | POA: Diagnosis not present

## 2021-01-27 DIAGNOSIS — F329 Major depressive disorder, single episode, unspecified: Secondary | ICD-10-CM | POA: Diagnosis not present

## 2021-01-30 ENCOUNTER — Encounter: Payer: BC Managed Care – PPO | Admitting: Obstetrics and Gynecology

## 2021-02-05 DIAGNOSIS — H1013 Acute atopic conjunctivitis, bilateral: Secondary | ICD-10-CM | POA: Diagnosis not present

## 2021-02-05 DIAGNOSIS — H0102B Squamous blepharitis left eye, upper and lower eyelids: Secondary | ICD-10-CM | POA: Diagnosis not present

## 2021-02-05 DIAGNOSIS — H16223 Keratoconjunctivitis sicca, not specified as Sjogren's, bilateral: Secondary | ICD-10-CM | POA: Diagnosis not present

## 2021-02-05 DIAGNOSIS — H0102A Squamous blepharitis right eye, upper and lower eyelids: Secondary | ICD-10-CM | POA: Diagnosis not present

## 2021-02-11 DIAGNOSIS — F329 Major depressive disorder, single episode, unspecified: Secondary | ICD-10-CM | POA: Diagnosis not present

## 2021-02-19 DIAGNOSIS — F329 Major depressive disorder, single episode, unspecified: Secondary | ICD-10-CM | POA: Diagnosis not present

## 2021-03-04 DIAGNOSIS — F329 Major depressive disorder, single episode, unspecified: Secondary | ICD-10-CM | POA: Diagnosis not present

## 2021-03-25 DIAGNOSIS — F329 Major depressive disorder, single episode, unspecified: Secondary | ICD-10-CM | POA: Diagnosis not present

## 2021-04-01 DIAGNOSIS — F329 Major depressive disorder, single episode, unspecified: Secondary | ICD-10-CM | POA: Diagnosis not present

## 2021-04-15 DIAGNOSIS — F329 Major depressive disorder, single episode, unspecified: Secondary | ICD-10-CM | POA: Diagnosis not present

## 2021-05-02 DIAGNOSIS — E7800 Pure hypercholesterolemia, unspecified: Secondary | ICD-10-CM | POA: Diagnosis not present

## 2021-05-02 DIAGNOSIS — F5101 Primary insomnia: Secondary | ICD-10-CM | POA: Diagnosis not present

## 2021-05-02 DIAGNOSIS — M79671 Pain in right foot: Secondary | ICD-10-CM | POA: Diagnosis not present

## 2021-05-02 DIAGNOSIS — G8929 Other chronic pain: Secondary | ICD-10-CM | POA: Diagnosis not present

## 2021-05-27 DIAGNOSIS — F331 Major depressive disorder, recurrent, moderate: Secondary | ICD-10-CM | POA: Diagnosis not present

## 2021-06-17 DIAGNOSIS — F329 Major depressive disorder, single episode, unspecified: Secondary | ICD-10-CM | POA: Diagnosis not present

## 2021-06-26 ENCOUNTER — Telehealth: Payer: Self-pay

## 2021-06-26 NOTE — Telephone Encounter (Signed)
LMOM to confirm pcp and last/next mammogram

## 2021-07-08 DIAGNOSIS — F329 Major depressive disorder, single episode, unspecified: Secondary | ICD-10-CM | POA: Diagnosis not present

## 2021-07-22 DIAGNOSIS — F329 Major depressive disorder, single episode, unspecified: Secondary | ICD-10-CM | POA: Diagnosis not present

## 2021-08-12 DIAGNOSIS — F329 Major depressive disorder, single episode, unspecified: Secondary | ICD-10-CM | POA: Diagnosis not present

## 2021-09-01 DIAGNOSIS — R8281 Pyuria: Secondary | ICD-10-CM | POA: Diagnosis not present

## 2021-09-01 DIAGNOSIS — R3915 Urgency of urination: Secondary | ICD-10-CM | POA: Diagnosis not present

## 2021-09-01 DIAGNOSIS — N3001 Acute cystitis with hematuria: Secondary | ICD-10-CM | POA: Diagnosis not present

## 2021-09-01 DIAGNOSIS — R350 Frequency of micturition: Secondary | ICD-10-CM | POA: Diagnosis not present

## 2021-09-09 DIAGNOSIS — F329 Major depressive disorder, single episode, unspecified: Secondary | ICD-10-CM | POA: Diagnosis not present

## 2021-09-25 DIAGNOSIS — F329 Major depressive disorder, single episode, unspecified: Secondary | ICD-10-CM | POA: Diagnosis not present

## 2021-09-30 DIAGNOSIS — F329 Major depressive disorder, single episode, unspecified: Secondary | ICD-10-CM | POA: Diagnosis not present

## 2021-10-07 DIAGNOSIS — F329 Major depressive disorder, single episode, unspecified: Secondary | ICD-10-CM | POA: Diagnosis not present

## 2021-10-08 DIAGNOSIS — J209 Acute bronchitis, unspecified: Secondary | ICD-10-CM | POA: Diagnosis not present

## 2021-10-14 DIAGNOSIS — F329 Major depressive disorder, single episode, unspecified: Secondary | ICD-10-CM | POA: Diagnosis not present

## 2021-11-03 DIAGNOSIS — E7800 Pure hypercholesterolemia, unspecified: Secondary | ICD-10-CM | POA: Diagnosis not present

## 2021-11-03 DIAGNOSIS — F32A Depression, unspecified: Secondary | ICD-10-CM | POA: Diagnosis not present

## 2021-11-03 DIAGNOSIS — F411 Generalized anxiety disorder: Secondary | ICD-10-CM | POA: Diagnosis not present

## 2021-11-03 DIAGNOSIS — G8929 Other chronic pain: Secondary | ICD-10-CM | POA: Diagnosis not present

## 2021-11-03 DIAGNOSIS — F4310 Post-traumatic stress disorder, unspecified: Secondary | ICD-10-CM | POA: Diagnosis not present

## 2021-11-03 DIAGNOSIS — F5101 Primary insomnia: Secondary | ICD-10-CM | POA: Diagnosis not present

## 2021-11-03 DIAGNOSIS — F419 Anxiety disorder, unspecified: Secondary | ICD-10-CM | POA: Diagnosis not present

## 2021-11-17 DIAGNOSIS — F411 Generalized anxiety disorder: Secondary | ICD-10-CM | POA: Diagnosis not present

## 2021-11-17 DIAGNOSIS — F32A Depression, unspecified: Secondary | ICD-10-CM | POA: Diagnosis not present

## 2021-11-17 DIAGNOSIS — F4310 Post-traumatic stress disorder, unspecified: Secondary | ICD-10-CM | POA: Diagnosis not present

## 2021-12-18 DIAGNOSIS — F411 Generalized anxiety disorder: Secondary | ICD-10-CM | POA: Diagnosis not present

## 2021-12-18 DIAGNOSIS — F4310 Post-traumatic stress disorder, unspecified: Secondary | ICD-10-CM | POA: Diagnosis not present

## 2021-12-18 DIAGNOSIS — F32A Depression, unspecified: Secondary | ICD-10-CM | POA: Diagnosis not present

## 2022-01-01 DIAGNOSIS — F32A Depression, unspecified: Secondary | ICD-10-CM | POA: Diagnosis not present

## 2022-01-01 DIAGNOSIS — F4310 Post-traumatic stress disorder, unspecified: Secondary | ICD-10-CM | POA: Diagnosis not present

## 2022-01-01 DIAGNOSIS — F411 Generalized anxiety disorder: Secondary | ICD-10-CM | POA: Diagnosis not present

## 2022-01-26 DIAGNOSIS — F4310 Post-traumatic stress disorder, unspecified: Secondary | ICD-10-CM | POA: Diagnosis not present

## 2022-01-26 DIAGNOSIS — F411 Generalized anxiety disorder: Secondary | ICD-10-CM | POA: Diagnosis not present

## 2022-01-26 DIAGNOSIS — F32A Depression, unspecified: Secondary | ICD-10-CM | POA: Diagnosis not present

## 2022-02-05 DIAGNOSIS — F4310 Post-traumatic stress disorder, unspecified: Secondary | ICD-10-CM | POA: Diagnosis not present

## 2022-02-05 DIAGNOSIS — F32A Depression, unspecified: Secondary | ICD-10-CM | POA: Diagnosis not present

## 2022-02-05 DIAGNOSIS — F411 Generalized anxiety disorder: Secondary | ICD-10-CM | POA: Diagnosis not present

## 2022-02-09 DIAGNOSIS — F32A Depression, unspecified: Secondary | ICD-10-CM | POA: Diagnosis not present

## 2022-02-09 DIAGNOSIS — F411 Generalized anxiety disorder: Secondary | ICD-10-CM | POA: Diagnosis not present

## 2022-02-09 DIAGNOSIS — F4310 Post-traumatic stress disorder, unspecified: Secondary | ICD-10-CM | POA: Diagnosis not present

## 2022-02-10 DIAGNOSIS — H16223 Keratoconjunctivitis sicca, not specified as Sjogren's, bilateral: Secondary | ICD-10-CM | POA: Diagnosis not present

## 2022-02-10 DIAGNOSIS — H0102A Squamous blepharitis right eye, upper and lower eyelids: Secondary | ICD-10-CM | POA: Diagnosis not present

## 2022-02-10 DIAGNOSIS — H0102B Squamous blepharitis left eye, upper and lower eyelids: Secondary | ICD-10-CM | POA: Diagnosis not present

## 2022-02-10 DIAGNOSIS — H1013 Acute atopic conjunctivitis, bilateral: Secondary | ICD-10-CM | POA: Diagnosis not present

## 2022-02-17 DIAGNOSIS — F4310 Post-traumatic stress disorder, unspecified: Secondary | ICD-10-CM | POA: Diagnosis not present

## 2022-02-17 DIAGNOSIS — F32A Depression, unspecified: Secondary | ICD-10-CM | POA: Diagnosis not present

## 2022-02-17 DIAGNOSIS — F411 Generalized anxiety disorder: Secondary | ICD-10-CM | POA: Diagnosis not present

## 2022-03-02 DIAGNOSIS — F4310 Post-traumatic stress disorder, unspecified: Secondary | ICD-10-CM | POA: Diagnosis not present

## 2022-03-02 DIAGNOSIS — F32A Depression, unspecified: Secondary | ICD-10-CM | POA: Diagnosis not present

## 2022-03-02 DIAGNOSIS — F411 Generalized anxiety disorder: Secondary | ICD-10-CM | POA: Diagnosis not present

## 2022-03-17 DIAGNOSIS — F411 Generalized anxiety disorder: Secondary | ICD-10-CM | POA: Diagnosis not present

## 2022-03-17 DIAGNOSIS — F4310 Post-traumatic stress disorder, unspecified: Secondary | ICD-10-CM | POA: Diagnosis not present

## 2022-03-17 DIAGNOSIS — F32A Depression, unspecified: Secondary | ICD-10-CM | POA: Diagnosis not present

## 2022-03-31 DIAGNOSIS — F32A Depression, unspecified: Secondary | ICD-10-CM | POA: Diagnosis not present

## 2022-03-31 DIAGNOSIS — F411 Generalized anxiety disorder: Secondary | ICD-10-CM | POA: Diagnosis not present

## 2022-03-31 DIAGNOSIS — F4310 Post-traumatic stress disorder, unspecified: Secondary | ICD-10-CM | POA: Diagnosis not present

## 2022-04-16 DIAGNOSIS — F4310 Post-traumatic stress disorder, unspecified: Secondary | ICD-10-CM | POA: Diagnosis not present

## 2022-04-16 DIAGNOSIS — F32A Depression, unspecified: Secondary | ICD-10-CM | POA: Diagnosis not present

## 2022-04-16 DIAGNOSIS — F411 Generalized anxiety disorder: Secondary | ICD-10-CM | POA: Diagnosis not present

## 2022-04-30 DIAGNOSIS — F411 Generalized anxiety disorder: Secondary | ICD-10-CM | POA: Diagnosis not present

## 2022-04-30 DIAGNOSIS — F32A Depression, unspecified: Secondary | ICD-10-CM | POA: Diagnosis not present

## 2022-04-30 DIAGNOSIS — F4310 Post-traumatic stress disorder, unspecified: Secondary | ICD-10-CM | POA: Diagnosis not present

## 2022-05-07 DIAGNOSIS — Z1211 Encounter for screening for malignant neoplasm of colon: Secondary | ICD-10-CM | POA: Diagnosis not present

## 2022-05-08 DIAGNOSIS — K5904 Chronic idiopathic constipation: Secondary | ICD-10-CM | POA: Diagnosis not present

## 2022-05-08 DIAGNOSIS — F419 Anxiety disorder, unspecified: Secondary | ICD-10-CM | POA: Diagnosis not present

## 2022-05-08 DIAGNOSIS — G894 Chronic pain syndrome: Secondary | ICD-10-CM | POA: Diagnosis not present

## 2022-06-04 DIAGNOSIS — F4310 Post-traumatic stress disorder, unspecified: Secondary | ICD-10-CM | POA: Diagnosis not present

## 2022-06-04 DIAGNOSIS — F411 Generalized anxiety disorder: Secondary | ICD-10-CM | POA: Diagnosis not present

## 2022-06-04 DIAGNOSIS — F32A Depression, unspecified: Secondary | ICD-10-CM | POA: Diagnosis not present

## 2022-12-11 DIAGNOSIS — F419 Anxiety disorder, unspecified: Secondary | ICD-10-CM | POA: Diagnosis not present

## 2022-12-11 DIAGNOSIS — E7800 Pure hypercholesterolemia, unspecified: Secondary | ICD-10-CM | POA: Diagnosis not present

## 2022-12-11 DIAGNOSIS — G894 Chronic pain syndrome: Secondary | ICD-10-CM | POA: Diagnosis not present

## 2022-12-11 DIAGNOSIS — H04123 Dry eye syndrome of bilateral lacrimal glands: Secondary | ICD-10-CM | POA: Diagnosis not present

## 2023-02-16 DIAGNOSIS — H43393 Other vitreous opacities, bilateral: Secondary | ICD-10-CM | POA: Diagnosis not present

## 2023-02-16 DIAGNOSIS — H0102A Squamous blepharitis right eye, upper and lower eyelids: Secondary | ICD-10-CM | POA: Diagnosis not present

## 2023-02-16 DIAGNOSIS — H1013 Acute atopic conjunctivitis, bilateral: Secondary | ICD-10-CM | POA: Diagnosis not present

## 2023-02-16 DIAGNOSIS — H2513 Age-related nuclear cataract, bilateral: Secondary | ICD-10-CM | POA: Diagnosis not present

## 2023-06-18 DIAGNOSIS — E7800 Pure hypercholesterolemia, unspecified: Secondary | ICD-10-CM | POA: Diagnosis not present

## 2023-06-18 DIAGNOSIS — G894 Chronic pain syndrome: Secondary | ICD-10-CM | POA: Diagnosis not present

## 2023-06-18 DIAGNOSIS — R5383 Other fatigue: Secondary | ICD-10-CM | POA: Diagnosis not present

## 2023-06-18 DIAGNOSIS — K5904 Chronic idiopathic constipation: Secondary | ICD-10-CM | POA: Diagnosis not present

## 2023-06-18 DIAGNOSIS — F419 Anxiety disorder, unspecified: Secondary | ICD-10-CM | POA: Diagnosis not present

## 2023-06-30 ENCOUNTER — Ambulatory Visit: Payer: BC Managed Care – PPO | Admitting: Nurse Practitioner

## 2023-07-05 DIAGNOSIS — E2749 Other adrenocortical insufficiency: Secondary | ICD-10-CM | POA: Diagnosis not present

## 2023-07-05 DIAGNOSIS — R635 Abnormal weight gain: Secondary | ICD-10-CM | POA: Diagnosis not present

## 2023-07-05 DIAGNOSIS — E7800 Pure hypercholesterolemia, unspecified: Secondary | ICD-10-CM | POA: Diagnosis not present

## 2023-07-05 DIAGNOSIS — G894 Chronic pain syndrome: Secondary | ICD-10-CM | POA: Diagnosis not present

## 2023-07-09 DIAGNOSIS — R635 Abnormal weight gain: Secondary | ICD-10-CM | POA: Diagnosis not present

## 2023-07-09 DIAGNOSIS — E2749 Other adrenocortical insufficiency: Secondary | ICD-10-CM | POA: Diagnosis not present

## 2023-11-02 DIAGNOSIS — E559 Vitamin D deficiency, unspecified: Secondary | ICD-10-CM | POA: Diagnosis not present

## 2023-11-02 DIAGNOSIS — E7800 Pure hypercholesterolemia, unspecified: Secondary | ICD-10-CM | POA: Diagnosis not present

## 2023-11-02 DIAGNOSIS — G894 Chronic pain syndrome: Secondary | ICD-10-CM | POA: Diagnosis not present

## 2023-11-02 DIAGNOSIS — E2749 Other adrenocortical insufficiency: Secondary | ICD-10-CM | POA: Diagnosis not present

## 2023-11-02 DIAGNOSIS — K5904 Chronic idiopathic constipation: Secondary | ICD-10-CM | POA: Diagnosis not present

## 2023-12-31 DIAGNOSIS — G894 Chronic pain syndrome: Secondary | ICD-10-CM | POA: Diagnosis not present

## 2023-12-31 DIAGNOSIS — F419 Anxiety disorder, unspecified: Secondary | ICD-10-CM | POA: Diagnosis not present

## 2023-12-31 DIAGNOSIS — K219 Gastro-esophageal reflux disease without esophagitis: Secondary | ICD-10-CM | POA: Diagnosis not present

## 2023-12-31 DIAGNOSIS — E7800 Pure hypercholesterolemia, unspecified: Secondary | ICD-10-CM | POA: Diagnosis not present

## 2024-02-25 DIAGNOSIS — H2513 Age-related nuclear cataract, bilateral: Secondary | ICD-10-CM | POA: Diagnosis not present

## 2024-02-25 DIAGNOSIS — H43392 Other vitreous opacities, left eye: Secondary | ICD-10-CM | POA: Diagnosis not present

## 2024-02-25 DIAGNOSIS — H1013 Acute atopic conjunctivitis, bilateral: Secondary | ICD-10-CM | POA: Diagnosis not present

## 2024-02-25 DIAGNOSIS — H43811 Vitreous degeneration, right eye: Secondary | ICD-10-CM | POA: Diagnosis not present

## 2024-03-01 DIAGNOSIS — E7800 Pure hypercholesterolemia, unspecified: Secondary | ICD-10-CM | POA: Diagnosis not present

## 2024-03-01 DIAGNOSIS — E2749 Other adrenocortical insufficiency: Secondary | ICD-10-CM | POA: Diagnosis not present

## 2024-03-01 DIAGNOSIS — Z79899 Other long term (current) drug therapy: Secondary | ICD-10-CM | POA: Diagnosis not present

## 2024-03-01 DIAGNOSIS — G894 Chronic pain syndrome: Secondary | ICD-10-CM | POA: Diagnosis not present

## 2024-06-06 DIAGNOSIS — E7800 Pure hypercholesterolemia, unspecified: Secondary | ICD-10-CM | POA: Diagnosis not present

## 2024-06-06 DIAGNOSIS — F419 Anxiety disorder, unspecified: Secondary | ICD-10-CM | POA: Diagnosis not present

## 2024-06-06 DIAGNOSIS — G2581 Restless legs syndrome: Secondary | ICD-10-CM | POA: Diagnosis not present

## 2024-06-06 DIAGNOSIS — G894 Chronic pain syndrome: Secondary | ICD-10-CM | POA: Diagnosis not present

## 2024-06-06 DIAGNOSIS — K219 Gastro-esophageal reflux disease without esophagitis: Secondary | ICD-10-CM | POA: Diagnosis not present

## 2024-06-14 DIAGNOSIS — H2513 Age-related nuclear cataract, bilateral: Secondary | ICD-10-CM | POA: Diagnosis not present

## 2024-06-14 DIAGNOSIS — H43392 Other vitreous opacities, left eye: Secondary | ICD-10-CM | POA: Diagnosis not present

## 2024-06-14 DIAGNOSIS — H1131 Conjunctival hemorrhage, right eye: Secondary | ICD-10-CM | POA: Diagnosis not present

## 2024-06-14 DIAGNOSIS — H43811 Vitreous degeneration, right eye: Secondary | ICD-10-CM | POA: Diagnosis not present

## 2024-06-16 ENCOUNTER — Encounter: Payer: Self-pay | Admitting: Advanced Practice Midwife

## 2024-07-03 DIAGNOSIS — E559 Vitamin D deficiency, unspecified: Secondary | ICD-10-CM | POA: Diagnosis not present

## 2024-07-03 DIAGNOSIS — K5904 Chronic idiopathic constipation: Secondary | ICD-10-CM | POA: Diagnosis not present

## 2024-07-03 DIAGNOSIS — E7800 Pure hypercholesterolemia, unspecified: Secondary | ICD-10-CM | POA: Diagnosis not present

## 2024-07-03 DIAGNOSIS — E2749 Other adrenocortical insufficiency: Secondary | ICD-10-CM | POA: Diagnosis not present

## 2024-07-10 DIAGNOSIS — S99911D Unspecified injury of right ankle, subsequent encounter: Secondary | ICD-10-CM | POA: Diagnosis not present

## 2024-07-10 DIAGNOSIS — S99921D Unspecified injury of right foot, subsequent encounter: Secondary | ICD-10-CM | POA: Diagnosis not present
